# Patient Record
Sex: Male | Born: 1990 | Race: White | Hispanic: No | Marital: Single | Smoking: Never smoker
Health system: Southern US, Community
[De-identification: ages and names within clinical notes are randomized; demographics above are authoritative.]

## PROBLEM LIST (undated history)

## (undated) DIAGNOSIS — F411 Generalized anxiety disorder: Secondary | ICD-10-CM

## (undated) DIAGNOSIS — M79609 Pain in unspecified limb: Secondary | ICD-10-CM

## (undated) DIAGNOSIS — I10 Essential (primary) hypertension: Secondary | ICD-10-CM

## (undated) DIAGNOSIS — IMO0002 Reserved for concepts with insufficient information to code with codable children: Secondary | ICD-10-CM

## (undated) DIAGNOSIS — R000 Tachycardia, unspecified: Secondary | ICD-10-CM

## (undated) HISTORY — DX: Reserved for concepts with insufficient information to code with codable children: IMO0002

## (undated) HISTORY — DX: Tachycardia, unspecified: R00.0

## (undated) HISTORY — DX: Pain in unspecified limb: M79.609

## (undated) HISTORY — DX: Essential (primary) hypertension: I10

## (undated) HISTORY — DX: Generalized anxiety disorder: F41.1

---

## 2010-08-30 ENCOUNTER — Ambulatory Visit: Payer: Self-pay | Admitting: Family Medicine

## 2010-11-24 ENCOUNTER — Encounter: Payer: Self-pay | Admitting: Internal Medicine

## 2010-11-24 ENCOUNTER — Ambulatory Visit (INDEPENDENT_AMBULATORY_CARE_PROVIDER_SITE_OTHER): Payer: BC Managed Care – PPO | Admitting: Internal Medicine

## 2010-11-24 ENCOUNTER — Telehealth: Payer: Self-pay | Admitting: Internal Medicine

## 2010-11-24 VITALS — BP 149/102 | HR 127 | Temp 98.7°F | Resp 20 | Ht 66.0 in | Wt 264.0 lb

## 2010-11-24 DIAGNOSIS — R000 Tachycardia, unspecified: Secondary | ICD-10-CM

## 2010-11-24 DIAGNOSIS — I10 Essential (primary) hypertension: Secondary | ICD-10-CM

## 2010-11-24 DIAGNOSIS — E785 Hyperlipidemia, unspecified: Secondary | ICD-10-CM

## 2010-11-24 DIAGNOSIS — M722 Plantar fascial fibromatosis: Secondary | ICD-10-CM

## 2010-11-24 MED ORDER — NEBIVOLOL HCL 5 MG PO TABS: 5.0000 mg | ORAL_TABLET | Freq: Every day | ORAL | Status: DC

## 2010-11-24 NOTE — Telephone Encounter (Signed)
Message copied by Virgina Evener on Fri Nov 24, 2010  6:41 PM ------      Message from: Ronna Polio A      Created: Fri Nov 24, 2010  3:37 PM      Regarding: EKG       EKG was normal.

## 2010-11-24 NOTE — Patient Instructions (Signed)
Labs next week. Follow up 2 weeks.

## 2010-11-24 NOTE — Progress Notes (Signed)
Subjective:    Patient ID: Guy Pearson, male    DOB: 1991-02-11, 20 y.o.   MRN: [id number]  HPI  Guy Pearson is a 20 year old male who presents to establish care. He reports that it has been many years since he has seen a physician. He notes that recently and when he checks his blood pressure it has been elevated. He reports some blurred vision, neck pain, and headaches associated with his high blood pressure. He has never taken meds for high blood pressure. He has a strong family history of high blood pressure and heart disease. He reports a diet that is high in saturated fat. He does not exercise regularly.  Guy Pearson is also concerned about pain in his right foot. The pain is located in his medial right heel. It has been present for several months. He has tried applying ice to this area. He has also tried nonsteroidals. He is currently using his father's foot inserts help with pain. He reports minimal improvement with this orthotic.   Review of Systems  Constitutional: Negative for fever, chills, activity change, appetite change, fatigue and unexpected weight change.  HENT: Positive for neck pain.   Eyes: Positive for visual disturbance.  Respiratory: Negative for cough and shortness of breath.   Cardiovascular: Negative for chest pain, palpitations and leg swelling.  Gastrointestinal: Negative for abdominal pain and abdominal distention.  Genitourinary: Negative for dysuria, urgency and difficulty urinating.  Musculoskeletal: Negative for arthralgias and gait problem.  Skin: Negative for color change and rash.  Neurological: Positive for headaches.  Hematological: Negative for adenopathy.  Psychiatric/Behavioral: Negative for sleep disturbance and dysphoric mood. The patient is not nervous/anxious.    BP 149/102  Pulse 127  Temp(Src) 98.7 F (37.1 C) (Oral)  Resp 20  Ht 5\' 6"  (1.676 m)  Wt 264 lb (119.75 kg)  BMI 42.61 kg/m2  SpO2 95%     Objective:   Physical Exam    Constitutional: He is oriented to person, place, and time. He appears well-developed and well-nourished. No distress.  HENT:  Head: Normocephalic and atraumatic.  Right Ear: Tympanic membrane, external ear and ear canal normal.  Left Ear: Tympanic membrane, external ear and ear canal normal.  Nose: Nose normal.  Mouth/Throat: Oropharynx is clear and moist. No oropharyngeal exudate or posterior oropharyngeal erythema.  Eyes: Conjunctivae and EOM are normal. Pupils are equal, round, and reactive to light. Right eye exhibits no discharge. Left eye exhibits no discharge. No scleral icterus.  Neck: Normal range of motion. Neck supple. No tracheal deviation present. No thyromegaly present.  Cardiovascular: Normal rate, regular rhythm and normal heart sounds.  Exam reveals no gallop and no friction rub.   No murmur heard. Pulmonary/Chest: Effort normal and breath sounds normal. No respiratory distress. He has no wheezes. He has no rales. He exhibits no tenderness.  Abdominal: Soft. Bowel sounds are normal. He exhibits no distension and no mass. There is no tenderness. There is no rebound and no guarding.  Musculoskeletal: Normal range of motion. He exhibits no edema.       Feet:  Lymphadenopathy:    He has no cervical adenopathy.  Neurological: He is alert and oriented to person, place, and time. No cranial nerve deficit. Coordination normal.  Skin: Skin is warm and dry. No rash noted. He is not diaphoretic. No erythema. No pallor.  Psychiatric: His behavior is normal. Judgment and thought content normal. His mood appears anxious. Cognition and memory are normal.  Assessment & Plan:  1. Hypertension -patient with hypertension. Patient was given samples of bystolic to start today. He will start with 5 mg daily. He will increase to 10 mg daily if his blood pressure continues to be above 140/90. We will check renal function and urine microalbumin with lab work. He had an EKG performed  today which showed sinus tachycardia but was otherwise normal. He will followup in 2 weeks.  2. Plantar fasciitis - patient with right plantar fasciitis. He has tried conservative measures including icing and nonsteroidals. We will plan to refer to podiatry for evaluation for orthotics and possible steroid injection.  3. Hyperlipidemia - Will check lipids with labs next week.  4. Health Maintenance - will check fasting lipids with labs next week. The patient was offered flu vaccine and tetanus and pertussis vaccine today but refused.

## 2010-11-27 ENCOUNTER — Encounter: Payer: Self-pay | Admitting: *Deleted

## 2010-11-27 NOTE — Telephone Encounter (Signed)
Patient notified by letter

## 2010-12-01 ENCOUNTER — Other Ambulatory Visit: Payer: BC Managed Care – PPO

## 2010-12-08 ENCOUNTER — Ambulatory Visit: Payer: BC Managed Care – PPO | Admitting: Internal Medicine

## 2011-02-14 ENCOUNTER — Encounter: Payer: Self-pay | Admitting: Internal Medicine

## 2011-02-14 ENCOUNTER — Ambulatory Visit (INDEPENDENT_AMBULATORY_CARE_PROVIDER_SITE_OTHER): Payer: BC Managed Care – PPO | Admitting: Internal Medicine

## 2011-02-14 ENCOUNTER — Ambulatory Visit (INDEPENDENT_AMBULATORY_CARE_PROVIDER_SITE_OTHER)
Admission: RE | Admit: 2011-02-14 | Discharge: 2011-02-14 | Disposition: A | Discharge status: Home / Self Care | Payer: BC Managed Care – PPO | Source: Ambulatory Visit | Attending: Internal Medicine | Admitting: Internal Medicine

## 2011-02-14 VITALS — BP 146/82 | HR 102 | Temp 98.4°F | Wt 262.0 lb

## 2011-02-14 DIAGNOSIS — J40 Bronchitis, not specified as acute or chronic: Secondary | ICD-10-CM

## 2011-02-14 DIAGNOSIS — F411 Generalized anxiety disorder: Secondary | ICD-10-CM

## 2011-02-14 DIAGNOSIS — F419 Anxiety disorder, unspecified: Secondary | ICD-10-CM

## 2011-02-14 MED ORDER — BUPROPION HCL ER (XL) 150 MG PO TB24: 150.0000 mg | ORAL_TABLET | Freq: Every day | ORAL | Status: DC

## 2011-02-14 MED ORDER — GUAIFENESIN-CODEINE 100-10 MG/5ML PO SYRP: 5.0000 mL | ORAL_SOLUTION | Freq: Three times a day (TID) | ORAL | Status: AC | PRN

## 2011-02-14 MED ORDER — ALPRAZOLAM 0.25 MG PO TABS: 0.2500 mg | ORAL_TABLET | Freq: Every evening | ORAL | Status: AC | PRN

## 2011-02-14 MED ORDER — AZITHROMYCIN 250 MG PO TABS: ORAL_TABLET | ORAL | Status: AC

## 2011-02-14 NOTE — Progress Notes (Signed)
Subjective:    Patient ID: Guy Pearson, male    DOB: 07/19/1990, 20 y.o.   MRN: [id number]  HPI 20 year old male presents for an acute visit complaining of five-day history of nasal congestion, cough, pleuritic chest pain, fever, chills, myalgia. He first developed symptoms about 5 days ago with sore throat and dry cough. He reports that the cough worsened over the weekend and was so bad that he had some emesis after coughing spells. He then developed some right-sided pleuritic chest pain and upper abdominal pain associated with cough. The cough continues to be dry with very little mucus. He's also noted some nasal congestion and sore throat. He tried rest and using over-the-counter cough and cold medicines this weekend with minimal improvement. He continues to feel fatigued and have some chest tightness and shortness of breath.  He is also concerned about a several year history of progressive anxiety. He notes that he often has panic attacks when interacting with individuals. He describes these episodes as severe escalation of anxiety with chest tightness and sweating. This has affected his ability to function in his job. He is interested in trying medication to help with this.  Outpatient Encounter Prescriptions as of 02/14/2011  Medication Sig Dispense Refill  . nebivolol (BYSTOLIC) 5 MG tablet Take 1 tablet (5 mg total) by mouth daily.  30 tablet  11    Review of Systems  Constitutional: Positive for fever and chills. Negative for activity change and fatigue.  HENT: Positive for congestion and sinus pressure. Negative for hearing loss, ear pain, nosebleeds, sore throat, rhinorrhea, sneezing, trouble swallowing, neck pain, neck stiffness, voice change, postnasal drip, tinnitus and ear discharge.   Eyes: Negative for discharge, redness, itching and visual disturbance.  Respiratory: Positive for cough, chest tightness and shortness of breath. Negative for wheezing and stridor.   Cardiovascular:  Negative for chest pain and leg swelling.  Gastrointestinal: Positive for abdominal pain.  Musculoskeletal: Positive for myalgias. Negative for arthralgias.  Skin: Negative for color change and rash.  Neurological: Negative for dizziness, facial asymmetry and headaches.  Psychiatric/Behavioral: Negative for sleep disturbance and dysphoric mood. The patient is nervous/anxious.    BP 146/82  Pulse 102  Temp(Src) 98.4 F (36.9 C) (Oral)  Wt 262 lb (118.842 kg)  SpO2 97%     Objective:   Physical Exam  Constitutional: He is oriented to person, place, and time. He appears well-developed and well-nourished. No distress.  HENT:  Head: Normocephalic and atraumatic.  Right Ear: External ear normal.  Left Ear: External ear normal.  Nose: Nose normal.  Mouth/Throat: Oropharynx is clear and moist. No oropharyngeal exudate.  Eyes: Conjunctivae and EOM are normal. Pupils are equal, round, and reactive to light. Right eye exhibits no discharge. Left eye exhibits no discharge. No scleral icterus.  Neck: Normal range of motion. Neck supple. No tracheal deviation present. No thyromegaly present.  Cardiovascular: Normal rate, regular rhythm and normal heart sounds.  Exam reveals no gallop and no friction rub.   No murmur heard. Pulmonary/Chest: Effort normal. No respiratory distress. He has decreased breath sounds (prolonged expiration). He has no wheezes. He has no rales. He exhibits no tenderness.  Musculoskeletal: Normal range of motion. He exhibits no edema.  Lymphadenopathy:    He has no cervical adenopathy.  Neurological: He is alert and oriented to person, place, and time. No cranial nerve deficit. Coordination normal.  Skin: Skin is warm and dry. No rash noted. He is not diaphoretic. No erythema. No pallor.  Psychiatric:  His behavior is normal. Judgment and thought content normal. His mood appears anxious.          Assessment & Plan:  1. Bronchitis -symptoms are most consistent with  viral upper respiratory infection that has progressed to bronchitis. Will get chest x-ray given his persistent cough and chest pain. Will treat with a azithromycin and use codeine as needed for cough. He will followup in one month or earlier if symptoms are not improving.  2. Anxiety - patient with severe anxiety and panic attacks. Will start Wellbutrin and will use Xanax as needed for severe episodes. We discussed that Xanax can be sedating and that he must be cautious with its use. He will followup in one month.

## 2011-03-15 ENCOUNTER — Ambulatory Visit (INDEPENDENT_AMBULATORY_CARE_PROVIDER_SITE_OTHER): Payer: BC Managed Care – PPO | Admitting: Internal Medicine

## 2011-03-15 ENCOUNTER — Encounter: Payer: Self-pay | Admitting: Internal Medicine

## 2011-03-15 DIAGNOSIS — M542 Cervicalgia: Secondary | ICD-10-CM | POA: Insufficient documentation

## 2011-03-15 DIAGNOSIS — F411 Generalized anxiety disorder: Secondary | ICD-10-CM

## 2011-03-15 DIAGNOSIS — J029 Acute pharyngitis, unspecified: Secondary | ICD-10-CM

## 2011-03-15 DIAGNOSIS — J020 Streptococcal pharyngitis: Secondary | ICD-10-CM

## 2011-03-15 DIAGNOSIS — R1310 Dysphagia, unspecified: Secondary | ICD-10-CM

## 2011-03-15 DIAGNOSIS — I10 Essential (primary) hypertension: Secondary | ICD-10-CM

## 2011-03-15 DIAGNOSIS — F419 Anxiety disorder, unspecified: Secondary | ICD-10-CM | POA: Insufficient documentation

## 2011-03-15 MED ORDER — AMOXICILLIN-POT CLAVULANATE 875-125 MG PO TABS: 1.0000 | ORAL_TABLET | Freq: Two times a day (BID) | ORAL | Status: DC

## 2011-03-15 MED ORDER — NEBIVOLOL HCL 5 MG PO TABS: 5.0000 mg | ORAL_TABLET | Freq: Every day | ORAL | Status: DC

## 2011-03-15 MED ORDER — BUPROPION HCL ER (XL) 300 MG PO TB24: 300.0000 mg | ORAL_TABLET | Freq: Every day | ORAL | Status: DC

## 2011-03-15 MED ORDER — CYCLOBENZAPRINE HCL 5 MG PO TABS: 5.0000 mg | ORAL_TABLET | Freq: Three times a day (TID) | ORAL | Status: DC | PRN

## 2011-03-15 NOTE — Assessment & Plan Note (Signed)
Pt has not been taking Bystolic. BP elevated. Goal BP 120/80. Will resume Bystolic today.  Follow up 1 month.
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
## 2011-03-15 NOTE — Assessment & Plan Note (Signed)
Sore throat x 5 days. Exudate and erythema on exam.  No cough, congestion. Consistent with strep. Will treat with augmentin and use ibuprofen prn pain. Follow up if symptoms not improving over next 72hr.

## 2011-03-15 NOTE — Assessment & Plan Note (Signed)
Symptoms improved but not completely resolved with Wellbutrin 150mg  daily. Will increase to 300mg  daily. Will continue prn xanax for episodes of panic. Follow up in 1 month.

## 2011-03-21 ENCOUNTER — Encounter: Payer: Self-pay | Admitting: Internal Medicine

## 2011-03-21 ENCOUNTER — Ambulatory Visit (INDEPENDENT_AMBULATORY_CARE_PROVIDER_SITE_OTHER): Payer: BC Managed Care – PPO | Admitting: Internal Medicine

## 2011-03-21 VITALS — BP 166/92 | HR 107 | Temp 98.0°F | Ht 66.0 in | Wt 264.0 lb

## 2011-03-21 DIAGNOSIS — J40 Bronchitis, not specified as acute or chronic: Secondary | ICD-10-CM

## 2011-03-21 DIAGNOSIS — I10 Essential (primary) hypertension: Secondary | ICD-10-CM

## 2011-03-21 DIAGNOSIS — F411 Generalized anxiety disorder: Secondary | ICD-10-CM

## 2011-03-21 DIAGNOSIS — M542 Cervicalgia: Secondary | ICD-10-CM

## 2011-03-21 DIAGNOSIS — F419 Anxiety disorder, unspecified: Secondary | ICD-10-CM

## 2011-03-21 MED ORDER — LEVOFLOXACIN 750 MG PO TABS: 750.0000 mg | ORAL_TABLET | Freq: Every day | ORAL | Status: AC

## 2011-03-21 MED ORDER — CYCLOBENZAPRINE HCL 10 MG PO TABS: 10.0000 mg | ORAL_TABLET | Freq: Three times a day (TID) | ORAL | Status: AC | PRN

## 2011-03-21 MED ORDER — METOPROLOL SUCCINATE ER 25 MG PO TB24: 25.0000 mg | ORAL_TABLET | Freq: Every day | ORAL | Status: DC

## 2011-03-21 NOTE — Assessment & Plan Note (Signed)
Symptoms and exam are most consistent with bronchitis and perhaps early pneumonia in the right lower lobe. Will treat with Levaquin x7 days. He will call or e-mail with update later this week. He will call immediately if symptoms are worsening. Otherwise, he will followup in one month.

## 2011-03-21 NOTE — Assessment & Plan Note (Signed)
Patient reports significant improvement with use of Flexeril. We'll plan to continue. Patient will followup in one month.

## 2011-03-21 NOTE — Assessment & Plan Note (Signed)
Patient reports significant improvement on current dose of Wellbutrin. We'll plan to continue. He will followup in one month.

## 2011-03-21 NOTE — Progress Notes (Signed)
Subjective:    Patient ID: Guy Pearson, male    DOB: 1990/05/05, 21 y.o.   MRN: [id number]  HPI 21 year old male with a recent history of strep pharyngitis presents for followup complaining of several days of cough, shortness of breath, diffuse myalgias, and chills. He reports that his symptoms developed about 48 hours ago. They were at their worst yesterday. He took ibuprofen with improvement in his myalgia. He notes that yesterday he had a dry cough which led to some chest and abdominal pain. He denies any cough productive of sputum. He reports some shortness of breath yesterday which is now resolved. He has not had fever. He reports compliance with his Augmentin which she was taking for strep throat diagnosed last week. Aside from Augmentin and ibuprofen he has not been using any other cough or cold medications.  In regards to his anxiety, he reports that the increase in his dose of Wellbutrin significantly improved his symptoms.  In regards to his chronic upper back pain, he reports significant improvement in his symptoms with the use of Flexeril.  In regards to his hypertension, he notes that he has not yet filled his prescription for her Bystolic. He notes that this prescription is over $50. He is also not yet had his lab work performed. He has not been regularly checking his blood pressure.  Outpatient Encounter Prescriptions as of 03/21/2011  Medication Sig Dispense Refill  . buPROPion (WELLBUTRIN XL) 300 MG 24 hr tablet Take 1 tablet (300 mg total) by mouth daily.  30 tablet  6  . cyclobenzaprine (FLEXERIL) 10 MG tablet Take 1 tablet (10 mg total) by mouth 3 (three) times daily as needed for muscle spasms.  60 tablet  3  . DISCONTD: cyclobenzaprine (FLEXERIL) 5 MG tablet Take 1 tablet (5 mg total) by mouth 3 (three) times daily as needed for muscle spasms.  30 tablet  1  . DISCONTD: nebivolol (BYSTOLIC) 5 MG tablet Take 1 tablet (5 mg total) by mouth daily.  30 tablet  11  . levofloxacin  (LEVAQUIN) 750 MG tablet Take 1 tablet (750 mg total) by mouth daily.  7 tablet  0  . metoprolol succinate (TOPROL-XL) 25 MG 24 hr tablet Take 1 tablet (25 mg total) by mouth daily.  90 tablet  3    Review of Systems  Constitutional: Positive for chills. Negative for fever, activity change and fatigue.  HENT: Positive for congestion and sore throat. Negative for hearing loss, ear pain, nosebleeds, rhinorrhea, sneezing, trouble swallowing, neck pain, neck stiffness, voice change, postnasal drip, sinus pressure, tinnitus and ear discharge.   Eyes: Negative for discharge, redness, itching and visual disturbance.  Respiratory: Positive for cough and shortness of breath. Negative for chest tightness, wheezing and stridor.   Cardiovascular: Negative for chest pain and leg swelling.  Musculoskeletal: Positive for myalgias, back pain and arthralgias.  Skin: Negative for color change and rash.  Neurological: Negative for dizziness, facial asymmetry and headaches.  Psychiatric/Behavioral: Negative for sleep disturbance. The patient is nervous/anxious.    BP 166/92  Pulse 107  Temp(Src) 98 F (36.7 C) (Oral)  Ht 5\' 6"  (1.676 m)  Wt 264 lb (119.75 kg)  BMI 42.61 kg/m2  SpO2 97%     Objective:   Physical Exam  Constitutional: He is oriented to person, place, and time. He appears well-developed and well-nourished. No distress.  HENT:  Head: Normocephalic and atraumatic.  Right Ear: External ear normal.  Left Ear: External ear normal.  Nose: Nose normal.  Mouth/Throat: Oropharynx is clear and moist. No oropharyngeal exudate.  Eyes: Conjunctivae and EOM are normal. Pupils are equal, round, and reactive to light. Right eye exhibits no discharge. Left eye exhibits no discharge. No scleral icterus.  Neck: Normal range of motion. Neck supple. No tracheal deviation present. No thyromegaly present.  Cardiovascular: Normal rate, regular rhythm and normal heart sounds.  Exam reveals no gallop and no  friction rub.   No murmur heard. Pulmonary/Chest: Effort normal. No accessory muscle usage. Not tachypneic. No respiratory distress. He has decreased breath sounds in the right lower field. He has no wheezes. He has rhonchi in the right lower field. He has no rales. He exhibits no tenderness.  Musculoskeletal: Normal range of motion. He exhibits no edema.  Lymphadenopathy:    He has no cervical adenopathy.  Neurological: He is alert and oriented to person, place, and time. No cranial nerve deficit. Coordination normal.  Skin: Skin is warm and dry. No rash noted. He is not diaphoretic. No erythema. No pallor.  Psychiatric: He has a normal mood and affect. His behavior is normal. Judgment and thought content normal.          Assessment & Plan:

## 2011-03-21 NOTE — Assessment & Plan Note (Signed)
Blood pressure poorly controlled. Patient has not been compliant with medications because of cost. He has also not yet had lab work performed. Will change medication to metoprolol starting at 25 mg daily. He will call or e-mail with update as to blood pressures and if any side effects. He will followup in clinic in one month.

## 2011-03-21 NOTE — Patient Instructions (Signed)
Start Levaquin (antibiotic) to treat bronchitis.  Stop Augmentin.  Stop Bystolic.  Start Metoprolol for blood pressure control.  Email or call with update later this week.

## 2011-03-23 ENCOUNTER — Ambulatory Visit: Payer: BC Managed Care – PPO | Admitting: Internal Medicine

## 2011-04-23 ENCOUNTER — Ambulatory Visit (INDEPENDENT_AMBULATORY_CARE_PROVIDER_SITE_OTHER): Payer: BC Managed Care – PPO | Admitting: Internal Medicine

## 2011-04-23 ENCOUNTER — Encounter: Payer: Self-pay | Admitting: Internal Medicine

## 2011-04-23 VITALS — BP 128/78 | HR 95 | Temp 98.3°F | Ht 66.0 in | Wt 264.0 lb

## 2011-04-23 DIAGNOSIS — I10 Essential (primary) hypertension: Secondary | ICD-10-CM

## 2011-04-23 DIAGNOSIS — F419 Anxiety disorder, unspecified: Secondary | ICD-10-CM

## 2011-04-23 DIAGNOSIS — R252 Cramp and spasm: Secondary | ICD-10-CM

## 2011-04-23 DIAGNOSIS — M542 Cervicalgia: Secondary | ICD-10-CM

## 2011-04-23 DIAGNOSIS — F411 Generalized anxiety disorder: Secondary | ICD-10-CM

## 2011-04-23 MED ORDER — BUPROPION HCL ER (XL) 300 MG PO TB24: 300.0000 mg | ORAL_TABLET | Freq: Every day | ORAL | Status: DC

## 2011-04-23 MED ORDER — METOPROLOL SUCCINATE ER 25 MG PO TB24
25.0000 mg | ORAL_TABLET | Freq: Every day | ORAL | Status: DC
Start: 2011-04-23 — End: 2011-10-22

## 2011-04-23 MED ORDER — CYCLOBENZAPRINE HCL 10 MG PO TABS: 10.0000 mg | ORAL_TABLET | Freq: Three times a day (TID) | ORAL | Status: DC | PRN

## 2011-04-23 NOTE — Assessment & Plan Note (Signed)
Well-controlled with Flexeril. Will continue.

## 2011-04-23 NOTE — Progress Notes (Signed)
Subjective:    Patient ID: Guy Pearson, male    DOB: 1990/05/25, 21 y.o.   MRN: [id number]  HPI 21 year old male with history of anxiety, hypertension, and chronic upper back pain presents for followup. In regards to his anxiety, he notes significant improvement with the use of Wellbutrin. He denies any side effects from this medication. In regards to his hypertension, he reports full compliance with his metoprolol. He denies any side effects from this medicine. He denies any chest pain, headache, palpitations. In regards to his chronic upper back pain, he notes some improvement with the use of Flexeril. He typically takes 10 mg at bedtime when he is having upper back cramping. He denies any side effects from this medicine. He denies having any new concerns today.  Outpatient Encounter Prescriptions as of 04/23/2011  Medication Sig Dispense Refill  . buPROPion (WELLBUTRIN XL) 300 MG 24 hr tablet Take 1 tablet (300 mg total) by mouth daily.  90 tablet  3  . cyclobenzaprine (FLEXERIL) 10 MG tablet Take 1 tablet (10 mg total) by mouth 3 (three) times daily as needed.  90 tablet  3  . metoprolol succinate (TOPROL-XL) 25 MG 24 hr tablet Take 1 tablet (25 mg total) by mouth daily.  90 tablet  3    Review of Systems  Constitutional: Negative for fever, chills, activity change, appetite change, fatigue and unexpected weight change.  HENT: Positive for neck pain.   Eyes: Negative for visual disturbance.  Respiratory: Negative for cough and shortness of breath.   Cardiovascular: Negative for chest pain, palpitations and leg swelling.  Gastrointestinal: Negative for abdominal pain and abdominal distention.  Genitourinary: Negative for dysuria, urgency and difficulty urinating.  Musculoskeletal: Positive for myalgias and back pain. Negative for arthralgias and gait problem.  Skin: Negative for color change and rash.  Hematological: Negative for adenopathy.  Psychiatric/Behavioral: Negative for sleep  disturbance and dysphoric mood. The patient is nervous/anxious.    BP 128/78  Pulse 95  Temp(Src) 98.3 F (36.8 C) (Oral)  Ht 5\' 6"  (1.676 m)  Wt 264 lb (119.75 kg)  BMI 42.61 kg/m2  SpO2 98%     Objective:   Physical Exam  Constitutional: He is oriented to person, place, and time. He appears well-developed and well-nourished. No distress.  HENT:  Head: Normocephalic and atraumatic.  Right Ear: External ear normal.  Left Ear: External ear normal.  Nose: Nose normal.  Mouth/Throat: Oropharynx is clear and moist. No oropharyngeal exudate.  Eyes: Conjunctivae and EOM are normal. Pupils are equal, round, and reactive to light. Right eye exhibits no discharge. Left eye exhibits no discharge. No scleral icterus.  Neck: Normal range of motion. Neck supple. No tracheal deviation present. No thyromegaly present.  Cardiovascular: Normal rate, regular rhythm and normal heart sounds.  Exam reveals no gallop and no friction rub.   No murmur heard. Pulmonary/Chest: Effort normal and breath sounds normal. No respiratory distress. He has no wheezes. He has no rales. He exhibits no tenderness.  Musculoskeletal: Normal range of motion. He exhibits no edema.       Cervical back: He exhibits pain and spasm. He exhibits normal range of motion and no tenderness.  Lymphadenopathy:    He has no cervical adenopathy.  Neurological: He is alert and oriented to person, place, and time. No cranial nerve deficit. Coordination normal.  Skin: Skin is warm and dry. No rash noted. He is not diaphoretic. No erythema. No pallor.  Psychiatric: He has a normal mood and affect.  His behavior is normal. Judgment and thought content normal.          Assessment & Plan:

## 2011-04-23 NOTE — Assessment & Plan Note (Signed)
Blood pressure well-controlled on metoprolol 25 mg daily. We will plan to continue. Followup in 6 months.

## 2011-04-23 NOTE — Assessment & Plan Note (Signed)
Improved with use of Wellbutrin. We'll continue at current dose. Followup 6 months.

## 2011-10-22 ENCOUNTER — Encounter: Payer: Self-pay | Admitting: Internal Medicine

## 2011-10-22 ENCOUNTER — Ambulatory Visit (INDEPENDENT_AMBULATORY_CARE_PROVIDER_SITE_OTHER): Payer: BC Managed Care – PPO | Admitting: Internal Medicine

## 2011-10-22 VITALS — BP 110/72 | HR 84 | Temp 97.8°F | Ht 66.0 in | Wt 263.5 lb

## 2011-10-22 DIAGNOSIS — I10 Essential (primary) hypertension: Secondary | ICD-10-CM

## 2011-10-22 DIAGNOSIS — F419 Anxiety disorder, unspecified: Secondary | ICD-10-CM

## 2011-10-22 DIAGNOSIS — M542 Cervicalgia: Secondary | ICD-10-CM

## 2011-10-22 DIAGNOSIS — J0110 Acute frontal sinusitis, unspecified: Secondary | ICD-10-CM

## 2011-10-22 DIAGNOSIS — F411 Generalized anxiety disorder: Secondary | ICD-10-CM

## 2011-10-22 MED ORDER — LEVOFLOXACIN 750 MG PO TABS: 750.0000 mg | ORAL_TABLET | Freq: Every day | ORAL | Status: AC

## 2011-10-22 MED ORDER — METOPROLOL SUCCINATE ER 25 MG PO TB24: 25.0000 mg | ORAL_TABLET | Freq: Every day | ORAL | Status: DC

## 2011-10-22 MED ORDER — BUPROPION HCL ER (XL) 300 MG PO TB24: 300.0000 mg | ORAL_TABLET | Freq: Every day | ORAL | Status: DC

## 2011-10-22 NOTE — Progress Notes (Signed)
Subjective:    Patient ID: Guy Pearson, male    DOB: [birth year], 21 y.o.   MRN: [id number]  HPI 21 year old male with history of hypertension and anxiety presents for followup. In regards to hypertension, he reports blood pressure has been well controlled with metoprolol. He denies any side effects from the medication. He did not bring a record of blood pressures today. In regards to anxiety, he reports symptoms well controlled with Wellbutrin. He denies any side effects from this medication.  He is concerned today about ongoing neck pain. He feels that chronic neck pain has recently worsened in the setting of sinusitis. He notes that he has frontal sinus pressure and pain with nasal congestion. Because of this, he is often up at night and he feels that this leads to increased tension in his upper back and neck and increased neck pain. He has been taking Flexeril with minimal improvement in his symptoms. He denies any shortness of breath, cough, fever, chills.  Outpatient Encounter Prescriptions as of 10/22/2011  Medication Sig Dispense Refill  . buPROPion (WELLBUTRIN XL) 300 MG 24 hr tablet Take 1 tablet (300 mg total) by mouth daily.  90 tablet  3  . cyclobenzaprine (FLEXERIL) 10 MG tablet Take 10 mg by mouth at bedtime as needed.      . metoprolol succinate (TOPROL-XL) 25 MG 24 hr tablet Take 1 tablet (25 mg total) by mouth daily.  90 tablet  3  . levofloxacin (LEVAQUIN) 750 MG tablet Take 1 tablet (750 mg total) by mouth daily.  7 tablet  0   BP 110/72  Pulse 84  Temp 97.8 F (36.6 C) (Oral)  Ht 5\' 6"  (1.676 m)  Wt 263 lb 8 oz (119.523 kg)  BMI 42.53 kg/m2  SpO2 97%  Review of Systems  Constitutional: Negative for fever, chills, activity change, appetite change, fatigue and unexpected weight change.  HENT: Positive for congestion and sinus pressure. Negative for sore throat and trouble swallowing.   Eyes: Negative for visual disturbance.  Respiratory: Negative for cough and shortness  of breath.   Cardiovascular: Negative for chest pain, palpitations and leg swelling.  Gastrointestinal: Negative for abdominal pain and abdominal distention.  Genitourinary: Negative for dysuria, urgency and difficulty urinating.  Musculoskeletal: Positive for myalgias, back pain and arthralgias. Negative for gait problem.  Skin: Negative for color change and rash.  Neurological: Positive for headaches.  Hematological: Negative for adenopathy.  Psychiatric/Behavioral: Negative for disturbed wake/sleep cycle and dysphoric mood. The patient is not nervous/anxious.        Objective:   Physical Exam  Constitutional: He is oriented to person, place, and time. He appears well-developed and well-nourished. No distress.  HENT:  Head: Normocephalic and atraumatic.  Right Ear: External ear and ear canal normal. A middle ear effusion is present.  Left Ear: External ear and ear canal normal. A middle ear effusion is present.  Nose: Mucosal edema present. Right sinus exhibits frontal sinus tenderness. Left sinus exhibits frontal sinus tenderness.  Mouth/Throat: Oropharynx is clear and moist. No oropharyngeal exudate.  Eyes: Conjunctivae and EOM are normal. Pupils are equal, round, and reactive to light. Right eye exhibits no discharge. Left eye exhibits no discharge. No scleral icterus.  Neck: Normal range of motion. Neck supple. No tracheal deviation present. No thyromegaly present.  Cardiovascular: Normal rate, regular rhythm and normal heart sounds.  Exam reveals no gallop and no friction rub.   No murmur heard. Pulmonary/Chest: Effort normal and breath sounds normal. No respiratory distress.  He has no wheezes. He has no rales. He exhibits no tenderness.  Musculoskeletal: Normal range of motion. He exhibits no edema.  Lymphadenopathy:    He has no cervical adenopathy.  Neurological: He is alert and oriented to person, place, and time. No cranial nerve deficit. Coordination normal.  Skin: Skin is  warm and dry. No rash noted. He is not diaphoretic. No erythema. No pallor.  Psychiatric: He has a normal mood and affect. His behavior is normal. Judgment and thought content normal.          Assessment & Plan:

## 2011-10-22 NOTE — Assessment & Plan Note (Signed)
Blood pressure well-controlled on current medication. Will continue. Encouraged patient to followup and have screening labs performed.

## 2011-10-22 NOTE — Assessment & Plan Note (Signed)
Symptoms well controlled with Wellbutrin 300 mg daily. Will continue. Follow up 6 months.

## 2011-10-22 NOTE — Assessment & Plan Note (Signed)
Symptoms consistent with acute frontal sinusitis. Will treat with Levaquin. Patient will call if symptoms are not improving.

## 2011-10-22 NOTE — Assessment & Plan Note (Signed)
Patient with chronic back pain. Feels that symptoms are recently made worse with sinus infection. Will treat sinus infection and continue Flexeril as needed. Patient will call if symptoms not improving. If no improvement, would favor referral to orthopedics.

## 2012-04-21 ENCOUNTER — Ambulatory Visit: Payer: BC Managed Care – PPO | Admitting: Internal Medicine

## 2012-04-24 ENCOUNTER — Ambulatory Visit (INDEPENDENT_AMBULATORY_CARE_PROVIDER_SITE_OTHER): Payer: BC Managed Care – PPO | Admitting: Internal Medicine

## 2012-04-24 ENCOUNTER — Encounter: Payer: Self-pay | Admitting: Internal Medicine

## 2012-04-24 VITALS — BP 140/90 | HR 114 | Temp 98.4°F | Wt 270.0 lb

## 2012-04-24 DIAGNOSIS — I10 Essential (primary) hypertension: Secondary | ICD-10-CM

## 2012-04-24 DIAGNOSIS — F419 Anxiety disorder, unspecified: Secondary | ICD-10-CM

## 2012-04-24 DIAGNOSIS — E669 Obesity, unspecified: Secondary | ICD-10-CM

## 2012-04-24 DIAGNOSIS — F411 Generalized anxiety disorder: Secondary | ICD-10-CM

## 2012-04-24 NOTE — Assessment & Plan Note (Signed)
Pt stopped medication. Symptoms relatively well controlled. He will call if any changes/concerns.

## 2012-04-24 NOTE — Progress Notes (Signed)
  Subjective:    Patient ID: Guy Pearson, male    DOB: 1990/10/16, 22 y.o.   MRN: [id number]  HPI 22YO male with HTN, obesity, anxiety presents for follow up. Stopped wellbutrin and metoprolol 1 month ago. Does not wish to take any medications at this time. Acknowledges some anxiety, but feels he can deal with this. Refuses to have labs drawn to assess renal function, lipids. Would prefer to make efforts at weight loss with exercise prior to labs. Does not feel that diet is contributing to weight gain.  Outpatient Encounter Prescriptions as of 04/24/2012  Medication Sig Dispense Refill  . [DISCONTINUED] buPROPion (WELLBUTRIN XL) 300 MG 24 hr tablet Take 1 tablet (300 mg total) by mouth daily.  90 tablet  3  . [DISCONTINUED] cyclobenzaprine (FLEXERIL) 10 MG tablet Take 10 mg by mouth at bedtime as needed.      . [DISCONTINUED] metoprolol succinate (TOPROL-XL) 25 MG 24 hr tablet Take 1 tablet (25 mg total) by mouth daily.  90 tablet  3   No facility-administered encounter medications on file as of 04/24/2012.   BP 140/90  Pulse 114  Temp(Src) 98.4 F (36.9 C) (Oral)  Wt 270 lb (122.471 kg)  BMI 43.6 kg/m2  SpO2 98%  Review of Systems  Constitutional: Negative for fever, chills, activity change, appetite change, fatigue and unexpected weight change.  Eyes: Negative for visual disturbance.  Respiratory: Negative for cough and shortness of breath.   Cardiovascular: Negative for chest pain, palpitations and leg swelling.  Gastrointestinal: Negative for abdominal pain and abdominal distention.  Genitourinary: Negative for dysuria, urgency and difficulty urinating.  Musculoskeletal: Negative for arthralgias and gait problem.  Skin: Negative for color change and rash.  Hematological: Negative for adenopathy.  Psychiatric/Behavioral: Negative for sleep disturbance and dysphoric mood. The patient is not nervous/anxious.        Objective:   Physical Exam  Constitutional: He is oriented to  person, place, and time. He appears well-developed and well-nourished. No distress.  HENT:  Head: Normocephalic and atraumatic.  Right Ear: External ear normal.  Left Ear: External ear normal.  Nose: Nose normal.  Mouth/Throat: Oropharynx is clear and moist. No oropharyngeal exudate.  Eyes: Conjunctivae and EOM are normal. Pupils are equal, round, and reactive to light. Right eye exhibits no discharge. Left eye exhibits no discharge. No scleral icterus.  Neck: Normal range of motion. Neck supple. No tracheal deviation present. No thyromegaly present.  Cardiovascular: Regular rhythm and normal heart sounds.  Tachycardia present.  Exam reveals no gallop and no friction rub.   No murmur heard. Pulmonary/Chest: Effort normal and breath sounds normal. No respiratory distress. He has no wheezes. He has no rales. He exhibits no tenderness.  Musculoskeletal: Normal range of motion. He exhibits no edema.  Lymphadenopathy:    He has no cervical adenopathy.  Neurological: He is alert and oriented to person, place, and time. No cranial nerve deficit. Coordination normal.  Skin: Skin is warm and dry. No rash noted. He is not diaphoretic. No erythema. No pallor.  Psychiatric: He has a normal mood and affect. His behavior is normal. Judgment and thought content normal.          Assessment & Plan:

## 2012-04-24 NOTE — Assessment & Plan Note (Signed)
BP Readings from Last 3 Encounters:  04/24/12 140/90  10/22/11 110/72  04/23/11 128/78   BP elevated today. Pt has not taken Metoprolol in over 1 month. Declines use of any other medications at this time. Declines check of renal function.

## 2012-04-24 NOTE — Assessment & Plan Note (Signed)
  Body mass index is 43.6 kg/(m^2).  Encouraged effort at weight loss including keeping a food diary and setting goal of exercise 3 times per week.

## 2013-03-20 ENCOUNTER — Ambulatory Visit: Payer: Self-pay | Admitting: Physician Assistant

## 2013-09-07 ENCOUNTER — Ambulatory Visit: Payer: Self-pay

## 2013-09-07 LAB — CBC WITH DIFFERENTIAL/PLATELET
Basophil #: 0 10*3/uL (ref 0.0–0.1)
Basophil %: 0.3 %
EOS ABS: 0.1 10*3/uL (ref 0.0–0.7)
Eosinophil %: 1.1 %
HCT: 46.5 % (ref 40.0–52.0)
HGB: 15.9 g/dL (ref 13.0–18.0)
Lymphocyte #: 2.6 10*3/uL (ref 1.0–3.6)
Lymphocyte %: 24.2 %
MCH: 29.8 pg (ref 26.0–34.0)
MCHC: 34.2 g/dL (ref 32.0–36.0)
MCV: 87 fL (ref 80–100)
MONOS PCT: 7.2 %
Monocyte #: 0.8 x10 3/mm (ref 0.2–1.0)
NEUTROS ABS: 7.3 10*3/uL — AB (ref 1.4–6.5)
Neutrophil %: 67.2 %
Platelet: 295 10*3/uL (ref 150–440)
RBC: 5.34 10*6/uL (ref 4.40–5.90)
RDW: 13 % (ref 11.5–14.5)
WBC: 10.9 10*3/uL — AB (ref 3.8–10.6)

## 2013-09-07 LAB — URINALYSIS, COMPLETE
Blood: NEGATIVE
Glucose,UR: NEGATIVE mg/dL (ref 0–75)
Ketone: NEGATIVE
Leukocyte Esterase: NEGATIVE
Nitrite: NEGATIVE
PH: 6 (ref 4.5–8.0)
Protein: 30
Specific Gravity: 1.025 (ref 1.003–1.030)

## 2013-09-07 LAB — COMPREHENSIVE METABOLIC PANEL
ALBUMIN: 4.2 g/dL (ref 3.4–5.0)
ALK PHOS: 72 U/L
ANION GAP: 10 (ref 7–16)
BUN: 18 mg/dL (ref 7–18)
Bilirubin,Total: 0.3 mg/dL (ref 0.2–1.0)
CHLORIDE: 101 mmol/L (ref 98–107)
CREATININE: 1 mg/dL (ref 0.60–1.30)
Calcium, Total: 9.4 mg/dL (ref 8.5–10.1)
Co2: 29 mmol/L (ref 21–32)
EGFR (African American): >60
EGFR (Non-African Amer.): >60
GLUCOSE: 100 mg/dL — AB (ref 65–99)
OSMOLALITY: 281 (ref 275–301)
POTASSIUM: 3.8 mmol/L (ref 3.5–5.1)
SGOT(AST): 17 U/L (ref 15–37)
SGPT (ALT): 38 U/L (ref 12–78)
Sodium: 140 mmol/L (ref 136–145)
TOTAL PROTEIN: 8.3 g/dL — AB (ref 6.4–8.2)

## 2013-09-07 LAB — AMYLASE: Amylase: 45 U/L (ref 25–115)

## 2013-09-07 LAB — MAGNESIUM: Magnesium: 2.2 mg/dL

## 2013-09-07 LAB — LIPASE, BLOOD: Lipase: 95 U/L (ref 73–393)

## 2013-09-07 LAB — RAPID STREP-A WITH REFLX: Micro Text Report: NEGATIVE

## 2013-09-09 LAB — URINE CULTURE

## 2013-09-10 LAB — BETA STREP CULTURE(ARMC)

## 2013-09-18 ENCOUNTER — Encounter: Payer: Self-pay | Admitting: *Deleted

## 2013-09-21 ENCOUNTER — Encounter: Payer: Self-pay | Admitting: Cardiovascular Disease

## 2013-09-21 ENCOUNTER — Ambulatory Visit (INDEPENDENT_AMBULATORY_CARE_PROVIDER_SITE_OTHER): Payer: BC Managed Care – PPO | Admitting: Cardiovascular Disease

## 2013-09-21 ENCOUNTER — Encounter (INDEPENDENT_AMBULATORY_CARE_PROVIDER_SITE_OTHER): Payer: Self-pay

## 2013-09-21 VITALS — BP 110/80 | HR 63 | Ht 66.0 in | Wt 240.5 lb

## 2013-09-21 DIAGNOSIS — E669 Obesity, unspecified: Secondary | ICD-10-CM

## 2013-09-21 DIAGNOSIS — F419 Anxiety disorder, unspecified: Secondary | ICD-10-CM

## 2013-09-21 DIAGNOSIS — F411 Generalized anxiety disorder: Secondary | ICD-10-CM

## 2013-09-21 DIAGNOSIS — I10 Essential (primary) hypertension: Secondary | ICD-10-CM

## 2013-09-21 DIAGNOSIS — R0602 Shortness of breath: Secondary | ICD-10-CM

## 2013-09-21 DIAGNOSIS — R0789 Other chest pain: Secondary | ICD-10-CM

## 2013-09-21 NOTE — Assessment & Plan Note (Signed)
Blood pressure is well controlled on today's visit. No changes made to the medications. 

## 2013-09-21 NOTE — Assessment & Plan Note (Signed)
Symptoms of shortness of breath likely from underlying anxiety. No prior issues before the recent medical homes with his father. Prior to this he was working out on a regular basis. No clinical signs of any cardiac pathology. We did mention if symptoms persist, regular treadmill stress test could be ordered

## 2013-09-21 NOTE — Patient Instructions (Signed)
You are doing well. No medication changes were made.  Ask Dr. Juanetta GoslingHawkins about an alternate medication: Trazodone for sleep Paxil for stress/anxiety instead of cymbalta  Please call if symptoms of shortness of breath do not improve  Please call us if you have new issues that need to be addressed before your next appt.

## 2013-09-21 NOTE — Assessment & Plan Note (Signed)
I suspect most of his symptoms are coming from anxiety. 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 Shortness of breath, chest tightness, insomnia, weight loss, poor appetite, not drinking. Significant fatigue.  he feels that he is having side effects from Cymbalta. We have suggested he talk with Dr. Juanetta GoslingHawkins. He did not take medication today . Uncertain if he might do better on a Holter medication such as Paxil, or other SSRI

## 2013-09-21 NOTE — Assessment & Plan Note (Signed)
Given his recent stressors adjustment disorder, recommended he start a regular walking program

## 2013-09-21 NOTE — Progress Notes (Signed)
   Patient ID: Guy Pearson, male    DOB: 07/14/1990, 23 y.o.   MRN: [id number]  HPI Comments: Guy Pearson is a 23 year old gentleman with history of anxiety, presenting with recent symptoms of shortness of breath, chest tightness, anorexia, insomnia. Referred by Dr. Juanetta GoslingHawkins for further evaluation.  His mother presents with him today and reports that she feels the patient has significant anxiety. There are significant home stressors. Patient's dad has colon cancer, recent surgical resection of his colon, now undergoing chemotherapy. Son has had to take all the responsibilities of their business. This has been relatively stressful.  He is up in the well, down 15 pounds per the patient, not drinking much. Reports that his stomach is upset, episodes of shortness of breath and chest tightness with exertion. Previously he was able to workout without any problems.   Recently was taking meloxicam for his back, this caused significant leg cramping  He recently started Cymbalta for anxiety. This has caused daytime fatigue that has been severe, nighttime insomnia EKG shows normal sinus rhythm with rate 63 beats a minute, no significant ST or T wave changes  Outpatient Encounter Prescriptions as of 09/21/2013  Medication Sig  . acetaminophen (TYLENOL) 500 MG tablet Take 500-1,000 mg by mouth 3 (three) times daily as needed.  Marland Kitchen. amLODipine (NORVASC) 5 MG tablet Take 5 mg by mouth daily.  . DULoxetine (CYMBALTA) 20 MG capsule Take 20 mg by mouth daily.  . metoprolol tartrate (LOPRESSOR) 25 MG tablet Take 25 mg by mouth 2 (two) times daily.    Review of Systems  Constitutional: Positive for fatigue.  HENT: Negative.   Eyes: Negative.   Respiratory: Positive for chest tightness and shortness of breath.   Cardiovascular: Negative.   Gastrointestinal: Negative.   Endocrine: Negative.   Musculoskeletal: Negative.   Skin: Negative.   Allergic/Immunologic: Negative.   Neurological: Negative.   Hematological:  Negative.   Psychiatric/Behavioral: Positive for sleep disturbance.  All other systems reviewed and are negative.   BP 110/80  Pulse 63  Ht 5\' 6"  (1.676 m)  Wt 240 lb 8 oz (109.09 kg)  BMI 38.84 kg/m2  Physical Exam  Nursing note and vitals reviewed. Constitutional: He is oriented to person, place, and time. He appears well-developed and well-nourished.  HENT:  Head: Normocephalic.  Nose: Nose normal.  Mouth/Throat: Oropharynx is clear and moist.  Eyes: Conjunctivae are normal. Pupils are equal, round, and reactive to light.  Neck: Normal range of motion. Neck supple. No JVD present.  Cardiovascular: Normal rate, regular rhythm, S1 normal, S2 normal, normal heart sounds and intact distal pulses.  Exam reveals no gallop and no friction rub.   No murmur heard. Pulmonary/Chest: Effort normal and breath sounds normal. No respiratory distress. He has no wheezes. He has no rales. He exhibits no tenderness.  Abdominal: Soft. Bowel sounds are normal. He exhibits no distension. There is no tenderness.  Musculoskeletal: Normal range of motion. He exhibits no edema and no tenderness.  Lymphadenopathy:    He has no cervical adenopathy.  Neurological: He is alert and oriented to person, place, and time. Coordination normal.  Skin: Skin is warm and dry. No rash noted. No erythema.  Psychiatric: He has a normal mood and affect. His behavior is normal. Judgment and thought content normal.      Assessment and Plan

## 2013-09-21 NOTE — Assessment & Plan Note (Signed)
Atypical chest discomfort. Coming on at rest, even in the exam room. Likely from anxiety. Recommended walking on a regular basis, stress reduction techniques. He likes playing video games.

## 2013-12-24 ENCOUNTER — Ambulatory Visit: Payer: Self-pay | Admitting: Physician Assistant

## 2015-03-16 ENCOUNTER — Other Ambulatory Visit: Payer: Self-pay | Admitting: Family Medicine

## 2015-04-19 ENCOUNTER — Other Ambulatory Visit: Payer: Self-pay | Admitting: Family Medicine

## 2015-12-13 IMAGING — CR DG CHEST 2V
1 series · 3 of 3 positions shown · non-contrast
Comparison: Chest x-ray of 09/07/2013

CLINICAL DATA: Reason bronchitis, now with chest tightness

EXAM:
CHEST  2 VIEW

[Series 1: pa · 0.17mm/px · 3 of 3 slices shown]
[im 1/3]
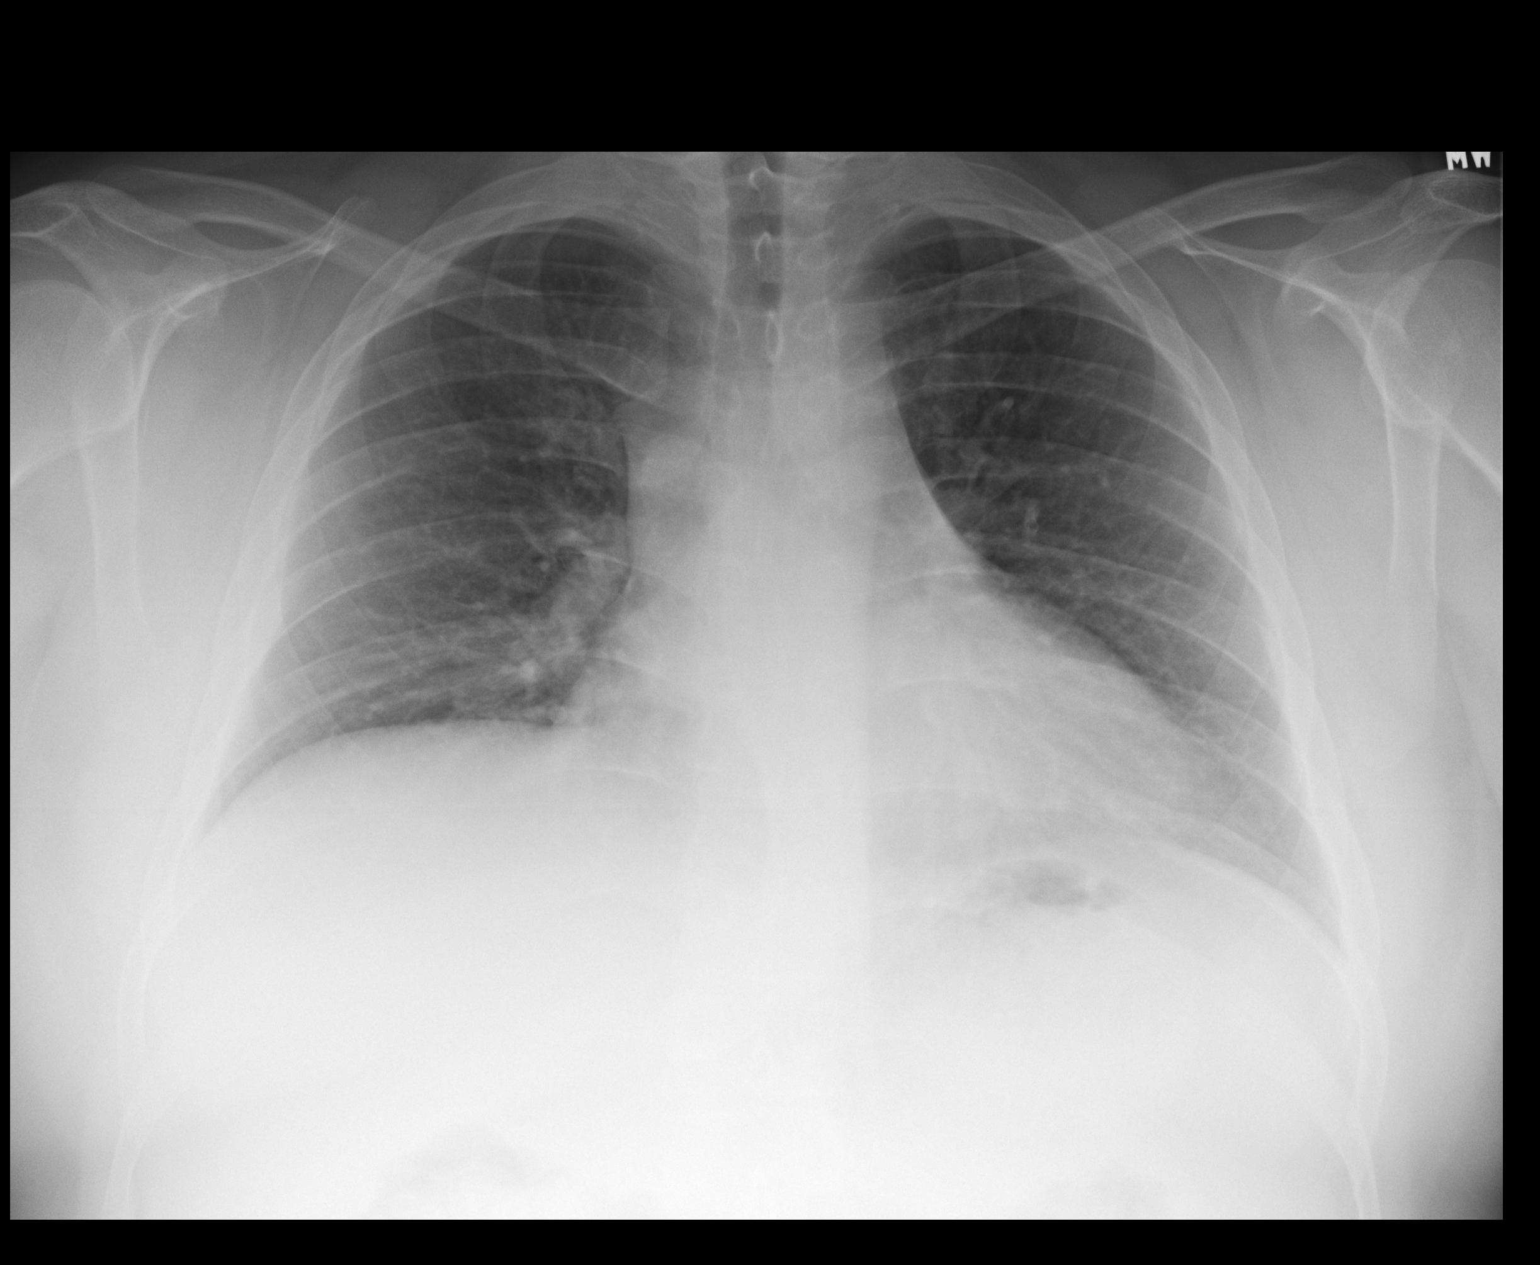
[im 2/3]
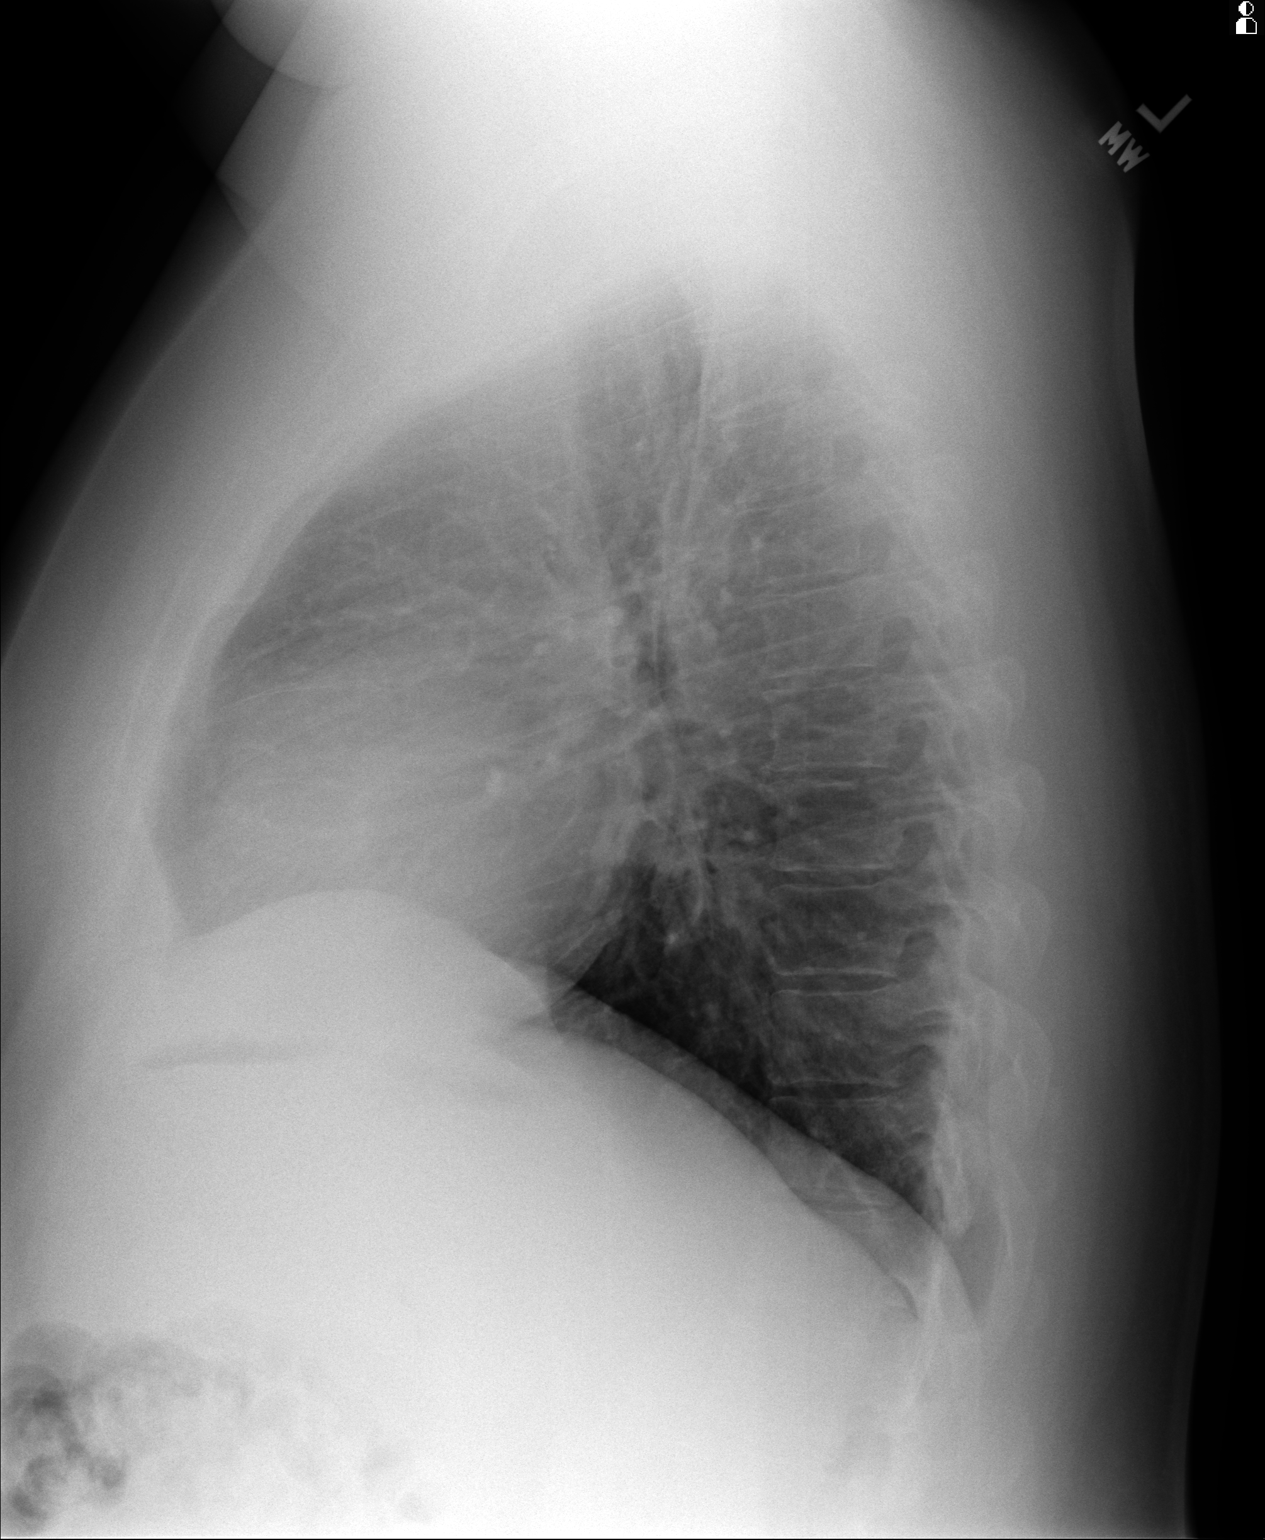
[im 3/3]
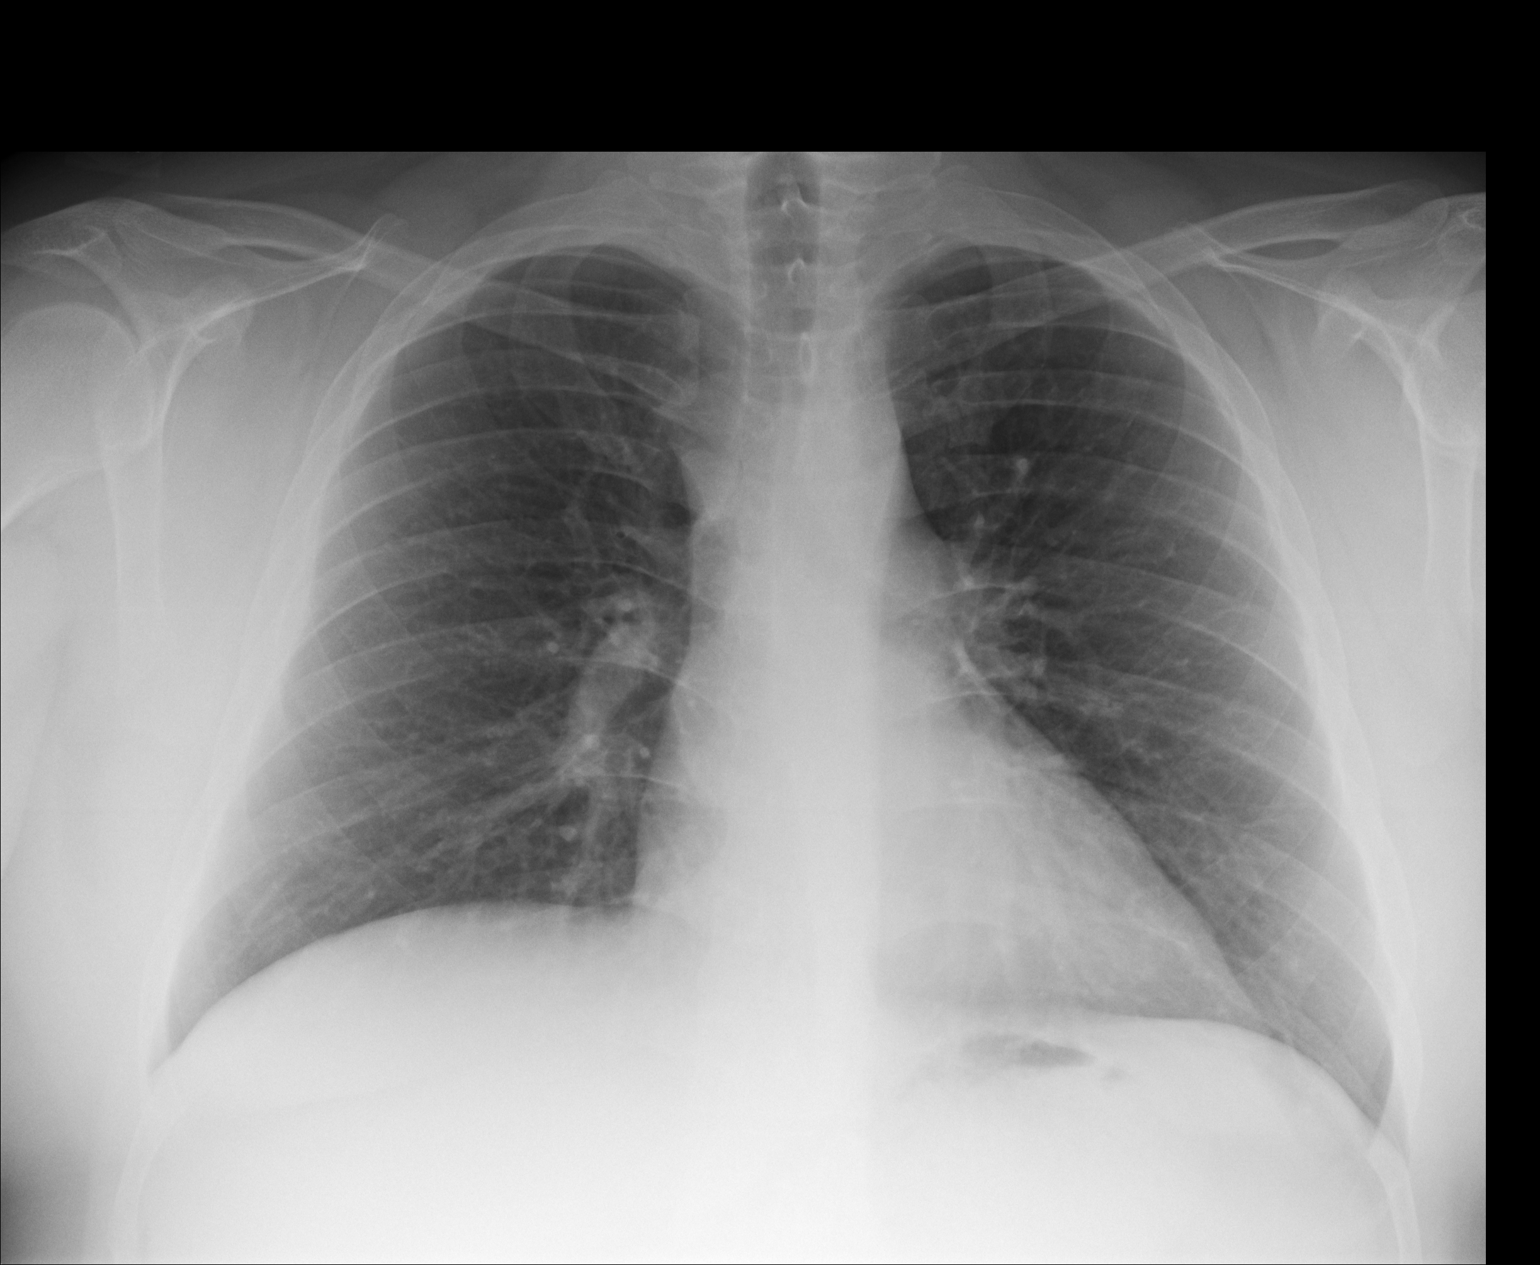

[3 of 3 positions shown; findings below may reference images not displayed]

FINDINGS: No active infiltrate or effusion is seen. Mediastinal and hilar
contours are unremarkable. The heart is within normal limits in
size. No bony abnormality is seen.
IMPRESSION: No active cardiopulmonary disease.

## 2019-10-16 ENCOUNTER — Other Ambulatory Visit: Payer: Self-pay

## 2019-10-16 ENCOUNTER — Ambulatory Visit
Admission: RE | Admit: 2019-10-16 | Discharge: 2019-10-16 | Disposition: A | Discharge status: Home / Self Care | Payer: BC Managed Care – PPO | Source: Ambulatory Visit | Attending: Family Medicine | Admitting: Family Medicine

## 2019-10-16 VITALS — BP 119/82 | HR 73 | Temp 98.9°F | Resp 16 | Ht 65.0 in | Wt 260.0 lb

## 2019-10-16 DIAGNOSIS — J029 Acute pharyngitis, unspecified: Secondary | ICD-10-CM | POA: Diagnosis not present

## 2019-10-16 LAB — GROUP A STREP BY PCR: Group A Strep by PCR: NOT DETECTED

## 2019-10-16 MED ORDER — AZITHROMYCIN 250 MG PO TABS: ORAL_TABLET | ORAL | 0 refills | Status: DC

## 2019-10-16 NOTE — Discharge Instructions (Signed)
Will call with results (need result before sending in treatment).  Take care  Dr. Adriana Simas

## 2019-10-16 NOTE — ED Triage Notes (Signed)
Patient c/o sore throat for the past 2 days.  Patient denies fevers.  

## 2019-10-16 NOTE — ED Provider Notes (Signed)
[facility]    CSN: [id number] Arrival date & time: 10/16/19  1055  History   Chief Complaint Chief Complaint  Patient presents with  . Appointment  . Sore Throat   HPI  29 year old male presents with sore throat.  2-day history of sore throat.  No fever.  No sick contacts.  Pain 3/10 in severity. Patient reports that he either has strep throat or tonsillitis.  No other associated symptoms.  With history  Past Medical History:  Diagnosis Date  . Anxiety state, unspecified   . Hypertension   . Other injury of other sites of trunk   . Pain in limb   . Tachycardia, unspecified    Patient Active Problem List   Diagnosis Date Noted  . Chest tightness 09/21/2013  . Shortness of breath 09/21/2013  . Obesity 04/24/2012  . Anxiety 03/15/2011  . Cervicalgia 03/15/2011  . Hypertension 11/24/2010  . Hyperlipidemia 11/24/2010   Home Medications    Prior to Admission medications   Medication Sig Start Date End Date Taking? Authorizing Provider  amLODipine (NORVASC) 5 MG tablet Take 5 mg by mouth daily.   Yes [provider]  DULoxetine (CYMBALTA) 20 MG capsule Take 20 mg by mouth daily.   Yes [provider]  metoprolol tartrate (LOPRESSOR) 25 MG tablet Take 25 mg by mouth 2 (two) times daily.   Yes [provider]  acetaminophen (TYLENOL) 500 MG tablet Take 500-1,000 mg by mouth 3 (three) times daily as needed.    [provider]  azithromycin (ZITHROMAX) 250 MG tablet 2 tablets on day 1, then 1 tablet daily on days 2-5. 10/16/19   Tommie Sams, DO    Family History Family History  Problem Relation Age of Onset  . Hypertension Mother   . Hypertension Father   . Arrhythmia Father        A-Fib  . Hyperlipidemia Paternal Grandfather   . Hypertension Paternal Grandfather   . Stroke Paternal Grandfather     Social History Social History   Tobacco Use  . Smoking status: Never Smoker  . Smokeless tobacco: Never Used  Vaping  Use  . Vaping Use: Never used  Substance Use Topics  . Alcohol use: No  . Drug use: No     Allergies   Menthol, Duloxetine, and Venlafaxine   Review of Systems Review of Systems  Constitutional: Negative for fever.  HENT: Positive for sore throat.    Physical Exam Triage Vital Signs ED Triage Vitals  Enc Vitals Group     BP 10/16/19 1109 119/82     Pulse Rate 10/16/19 1109 73     Resp 10/16/19 1109 16     Temp 10/16/19 1109 98.9 F (37.2 C)     Temp Source 10/16/19 1109 Oral     SpO2 10/16/19 1109 98 %     Weight 10/16/19 1106 260 lb (117.9 kg)     Height 10/16/19 1106 5\' 5"  (1.651 m)     Head Circumference --      Peak Flow --      Pain Score 10/16/19 1106 3     Pain Loc --      Pain Edu? --      Excl. in GC? --    Updated Vital Signs BP 119/82 (BP Location: Right Arm)   Pulse 73   Temp 98.9 F (37.2 C) (Oral)   Resp 16   Ht 5\' 5"  (1.651 m)   Wt 117.9 kg  SpO2 98%   BMI 43.27 kg/m   Visual Acuity Right Eye Distance:   Left Eye Distance:   Bilateral Distance:    Right Eye Near:   Left Eye Near:    Bilateral Near:     Physical Exam Vitals and nursing note reviewed.  Constitutional:      General: He is not in acute distress.    Appearance: Normal appearance. He is obese. He is not ill-appearing.  HENT:     Head: Normocephalic and atraumatic.     Mouth/Throat:     Pharynx: Oropharyngeal exudate and posterior oropharyngeal erythema present.  Eyes:     General:        Right eye: No discharge.        Left eye: No discharge.     Conjunctiva/sclera: Conjunctivae normal.  Cardiovascular:     Rate and Rhythm: Normal rate and regular rhythm.     Heart sounds: No murmur heard.   Pulmonary:     Effort: Pulmonary effort is normal.     Breath sounds: Normal breath sounds. No wheezing, rhonchi or rales.  Neurological:     Mental Status: He is alert.  Psychiatric:        Mood and Affect: Mood normal.        Behavior: Behavior normal.    UC  Treatments / Results  Labs (all labs ordered are listed, but only abnormal results are displayed) Labs Reviewed  GROUP A STREP BY PCR    EKG   Radiology No results found.  Procedures Procedures (including critical care time)  Medications Ordered in UC Medications - No data to display  Initial Impression / Assessment and Plan / UC Course  I have reviewed the triage vital signs and the nursing notes.  Pertinent labs & imaging results that were available during my care of the patient were reviewed by me and considered in my medical decision making (see chart for details).    29 year old male presents with pharyngitis.  Strep negative.  Atypical pathogens/other nongroup A strep versus viral.  Placing on azithromycin.  Final Clinical Impressions(s) / UC Diagnoses   Final diagnoses:  Acute pharyngitis, unspecified etiology     Discharge Instructions     Will call with results (need result before sending in treatment).  Take care  Dr. Adriana Simas    ED Prescriptions    Medication Sig Dispense Auth. Provider   azithromycin (ZITHROMAX) 250 MG tablet 2 tablets on day 1, then 1 tablet daily on days 2-5. 6 tablet Tommie Sams, DO     PDMP not reviewed this encounter.   Tommie Sams, [place] 10/16/19 1730

## 2020-06-21 ENCOUNTER — Ambulatory Visit (INDEPENDENT_AMBULATORY_CARE_PROVIDER_SITE_OTHER): Payer: BC Managed Care – PPO | Admitting: Urology

## 2020-06-21 ENCOUNTER — Other Ambulatory Visit: Payer: Self-pay

## 2020-06-21 ENCOUNTER — Encounter: Payer: Self-pay | Admitting: Urology

## 2020-06-21 VITALS — BP 118/74 | HR 80 | Ht 65.0 in | Wt 271.0 lb

## 2020-06-21 DIAGNOSIS — R7989 Other specified abnormal findings of blood chemistry: Secondary | ICD-10-CM | POA: Diagnosis not present

## 2020-06-21 DIAGNOSIS — N529 Male erectile dysfunction, unspecified: Secondary | ICD-10-CM

## 2020-06-21 MED ORDER — TADALAFIL 5 MG PO TABS: 5.0000 mg | ORAL_TABLET | ORAL | 6 refills | Status: DC

## 2020-06-21 MED ORDER — CLOMIPHENE CITRATE 50 MG PO TABS: 25.0000 mg | ORAL_TABLET | Freq: Every day | ORAL | 6 refills | Status: DC

## 2020-06-21 NOTE — Patient Instructions (Signed)
Erectile Dysfunction Erectile dysfunction (ED) is the inability to get or keep an erection in order to have sexual intercourse. ED is considered a symptom of an underlying disorder and not considered a disease. Erectile dysfunction may include:  Inability to get an erection.  Lack of enough hardness of the erection to allow penetration.  Loss of the erection before sex is finished. What are the causes? This condition may be caused by:  Certain medicines, such as: ? Pain relievers. ? Antihistamines. ? Antidepressants. ? Blood pressure medicines. ? Water pills (diuretics). ? Ulcer medicines. ? Muscle relaxants. ? Drugs.  Excessive drinking.  Psychological causes, such as: ? Anxiety. ? Depression. ? Sadness. ? Exhaustion. ? Performance fear. ? Stress.  Physical causes, such as: ? Artery problems. This may include diabetes, smoking, liver disease, or atherosclerosis. ? High blood pressure. ? Hormonal problems, such as low testosterone. ? Obesity. ? Nerve problems. This may include back or pelvic injuries, diabetes mellitus, multiple sclerosis, or Parkinson's disease. What are the signs or symptoms? Symptoms of this condition include:  Inability to get an erection.  Lack of enough hardness of the erection to allow penetration.  Loss of the erection before sex is finished.  Normal erections at some times, but with frequent unsatisfactory episodes.  Low sexual satisfaction in either partner due to erection problems.  A curved penis occurring with erection. The curve may cause pain or the penis may be too curved to allow for intercourse.  Never having nighttime erections. How is this diagnosed? This condition is often diagnosed by:  Performing a physical exam to find other diseases or specific problems with the penis.  Asking you detailed questions about the problem.  Performing blood tests to check for diabetes mellitus or to measure hormone levels.  Performing  other tests to check for underlying health conditions.  Performing an ultrasound exam to check for scarring.  Performing a test to check blood flow to the penis.  Doing a sleep study at home to measure nighttime erections. How is this treated? This condition may be treated by:  Medicine taken by mouth to help you achieve an erection (oral medicine).  Hormone replacement therapy to replace low testosterone levels.  Medicine that is injected into the penis. Your health care provider may instruct you how to give yourself these injections at home.  Vacuum pump. This is a pump with a ring on it. The pump and ring are placed on the penis and used to create pressure that helps the penis become erect.  Penile implant surgery. In this procedure, you may receive: ? An inflatable implant. This consists of cylinders, a pump, and a reservoir. The cylinders can be inflated with a fluid that helps to create an erection, and they can be deflated after intercourse. ? A semi-rigid implant. This consists of two silicone rubber rods. The rods provide some rigidity. They are also flexible, so the penis can both curve downward in its normal position and become straight for sexual intercourse.  Blood vessel surgery, to improve blood flow to the penis. During this procedure, a blood vessel from a different part of the body is placed into the penis to allow blood to flow around (bypass) damaged or blocked blood vessels.  Lifestyle changes, such as exercising more, losing weight, and quitting smoking. Follow these instructions at home: Medicines  Take over-the-counter and prescription medicines only as told by your health care provider. Do not increase the dosage without first discussing it with your health care   provider.  If you are using self-injections, perform injections as directed by your health care provider. Make sure to avoid any veins that are on the surface of the penis. After giving an injection,  apply pressure to the injection site for 5 minutes.   General instructions  Exercise regularly, as directed by your health care provider. Work with your health care provider to lose weight, if needed.  Do not use any products that contain nicotine or tobacco, such as cigarettes and e-cigarettes. If you need help quitting, ask your health care provider.  Before using a vacuum pump, read the instructions that come with the pump and discuss any questions with your health care provider.  Keep all follow-up visits as told by your health care provider. This is important. Contact a health care provider if:  You feel nauseous.  You vomit. Get help right away if:  You are taking oral or injectable medicines and you have an erection that lasts longer than 4 hours. If your health care provider is unavailable, go to the nearest emergency room for evaluation. An erection that lasts much longer than 4 hours can result in permanent damage to your penis.  You have severe pain in your groin or abdomen.  You develop redness or severe swelling of your penis.  You have redness spreading up into your groin or lower abdomen.  You are unable to urinate.  You experience chest pain or a rapid heart beat (palpitations) after taking oral medicines. Summary  Erectile dysfunction (ED) is the inability to get or keep an erection during sexual intercourse. This problem can usually be treated successfully.  This condition is diagnosed based on a physical exam, your symptoms, and tests to determine the cause. Treatment varies depending on the cause and may include medicines, hormone therapy, surgery, or a vacuum pump.  You may need follow-up visits to make sure that you are using your medicines or devices correctly.  Get help right away if you are taking or injecting medicines and you have an erection that lasts longer than 4 hours. This information is not intended to replace advice given to you by your health  care provider. Make sure you discuss any questions you have with your health care provider. Document Revised: 10/30/2019 Document Reviewed: 10/30/2019 Elsevier Patient Education  2021 Elsevier Inc.  Tadalafil tablets (Cialis) What is this medicine? TADALAFIL (tah DA la fil) is used to treat erection problems in men. It is also used for enlargement of the prostate gland in men, a condition called benign prostatic hyperplasia or BPH. This medicine improves urine flow and reduces BPH symptoms. This medicine can also treat both erection problems and BPH when they occur together. This medicine may be used for other purposes; ask your health care provider or pharmacist if you have questions. COMMON BRAND NAME(S): Adcirca, ALYQ, Cialis What should I tell my health care provider before I take this medicine? They need to know if you have any of these conditions:  bleeding disorders  eye or vision problems, including a rare inherited eye disease called retinitis pigmentosa  anatomical deformation of the penis, Peyronie's disease, or history of priapism (painful and prolonged erection)  heart disease, angina, a history of heart attack, irregular heart beats, or other heart problems  high or low blood pressure  history of blood diseases, like sickle cell anemia or leukemia  history of stomach bleeding  kidney disease  liver disease  stroke  an unusual or allergic reaction to tadalafil, other medicines,   foods, dyes, or preservatives  pregnant or trying to get pregnant  breast-feeding How should I use this medicine? Take this medicine by mouth with a glass of water. Follow the directions on the prescription label. You may take this medicine with or without meals. When this medicine is used for erection problems, your doctor may prescribe it to be taken once daily or as needed. If you are taking the medicine as needed, you may be able to have sexual activity 30 minutes after taking it and for  up to 36 hours after taking it. Whether you are taking the medicine as needed or once daily, you should not take more than one dose per day. If you are taking this medicine for symptoms of benign prostatic hyperplasia (BPH) or to treat both BPH and an erection problem, take the dose once daily at about the same time each day. Do not take your medicine more often than directed. Talk to your pediatrician regarding the use of this medicine in children. Special care may be needed. Overdosage: If you think you have taken too much of this medicine contact a poison control center or emergency room at once. NOTE: This medicine is only for you. Do not share this medicine with others. What if I miss a dose? If you are taking this medicine as needed for erection problems, this does not apply. If you miss a dose while taking this medicine once daily for an erection problem, benign prostatic hyperplasia, or both, take it as soon as you remember, but do not take more than one dose per day. What may interact with this medicine? Do not take this medicine with any of the following medications:  nitrates like amyl nitrite, isosorbide dinitrate, isosorbide mononitrate, nitroglycerin  other medicines for erectile dysfunction like avanafil, sildenafil, vardenafil  other tadalafil products (Adcirca)  riociguat This medicine may also interact with the following medications:  certain drugs for high blood pressure  certain drugs for the treatment of HIV infection or AIDS  certain drugs used for fungal or yeast infections, like fluconazole, itraconazole, ketoconazole, and voriconazole  certain drugs used for seizures like carbamazepine, phenytoin, and phenobarbital  grapefruit juice  macrolide antibiotics like clarithromycin, erythromycin, troleandomycin  medicines for prostate problems  rifabutin, rifampin or rifapentine This list may not describe all possible interactions. Give your health care provider a  list of all the medicines, herbs, non-prescription drugs, or dietary supplements you use. Also tell them if you smoke, drink alcohol, or use illegal drugs. Some items may interact with your medicine. What should I watch for while using this medicine? If you notice any changes in your vision while taking this drug, call your doctor or health care professional as soon as possible. Stop using this medicine and call your health care provider right away if you have a loss of sight in one or both eyes. Contact your doctor or health care professional right away if the erection lasts longer than 4 hours or if it becomes painful. This may be a sign of serious problem and must be treated right away to prevent permanent damage. If you experience symptoms of nausea, dizziness, chest pain or arm pain upon initiation of sexual activity after taking this medicine, you should refrain from further activity and call your doctor or health care professional as soon as possible. Do not drink alcohol to excess (examples, 5 glasses of wine or 5 shots of whiskey) when taking this medicine. When taken in excess, alcohol can increase your chances   of getting a headache or getting dizzy, increasing your heart rate or lowering your blood pressure. Using this medicine does not protect you or your partner against HIV infection (the virus that causes AIDS) or other sexually transmitted diseases. What side effects may I notice from receiving this medicine? Side effects that you should report to your doctor or health care professional as soon as possible:  allergic reactions like skin rash, itching or hives, swelling of the face, lips, or tongue  breathing problems  changes in hearing  changes in vision  chest pain  fast, irregular heartbeat  prolonged or painful erection  seizures Side effects that usually do not require medical attention (report to your doctor or health care professional if they continue or are  bothersome):  back pain  dizziness  flushing  headache  indigestion  muscle aches  nausea  stuffy or runny nose This list may not describe all possible side effects. Call your doctor for medical advice about side effects. You may report side effects to FDA at 1-800-FDA-1088. Where should I keep my medicine? Keep out of the reach of children. Store at room temperature between 15 and 30 degrees C (59 and 86 degrees F). Throw away any unused medicine after the expiration date. NOTE: This sheet is a summary. It may not cover all possible information. If you have questions about this medicine, talk to your doctor, pharmacist, or health care provider.  2021 Elsevier/Gold Standard (2013-07-03 13:15:49)  

## 2020-06-21 NOTE — Progress Notes (Signed)
   06/21/20 1:14 PM   Guy Pearson March 25, 1990 [id number]  CC: Erectile dysfunction, low testosterone  HPI: I saw Guy Pearson for the above issues.  He is a 30 year old male with morbid obesity and BMI of 45, hypertension on amlodipine and metoprolol, and hypercholesterolemia who reports erectile dysfunction and low testosterone.  He has never tried any medications for this.  An AM testosterone with PCP on 06/06/2020 was low at 180.  He is starting to work on some diet changes.  He does not smoke.  He is married with 1 daughter, and he and his wife are interested in further pregnancies, and currently trying to get pregnant.  She has PCOS, and they have had significant challenges with pregnancies previously.  He was told once that he had low sperm count on a semen analysis.  He has been told to have a sleep apnea study previously, but has not had this performed.   PMH: Past Medical History:  Diagnosis Date  . Anxiety state, unspecified   . Hypertension   . Other injury of other sites of trunk   . Pain in limb   . Tachycardia, unspecified    Family History: Family History  Problem Relation Age of Onset  . Hypertension Mother   . Hypertension Father   . Arrhythmia Father        A-Fib  . Hyperlipidemia Paternal Grandfather   . Hypertension Paternal Grandfather   . Stroke Paternal Grandfather     Social History:  reports that he has never smoked. He has never used smokeless tobacco. He reports that he does not drink alcohol and does not use drugs.  Physical Exam: BP 118/74   Pulse 80   Ht 5\' 5"  (1.651 m)   Wt 271 lb (122.9 kg)   BMI 45.10 kg/m    Constitutional:  Alert and oriented, No acute distress.  Obese Cardiovascular: No clubbing, cyanosis, or edema. Respiratory: Normal respiratory effort, no increased work of breathing. GI: Abdomen is soft, nontender, nondistended, no abdominal masses GU: Buried penis, testicles 20 cc and descended bilaterally without  masses  Laboratory Data: Reviewed, see HPI  Assessment & Plan:   30 year old male with morbid obesity and BMI of 45, hypertension on amlodipine and metoprolol, with low testosterone of 180, decreased energy, weight gain, and erectile dysfunction.  Regarding his erectile dysfunction, we discussed lifestyle modifications extensively including exercise, weight loss, healthy eating, as well as the impact of blood pressure medications like metoprolol on ED.  I recommended a trial of Cialis 5 mg every other day to start.  Risks and benefits discussed.  In terms of the low testosterone, we again reviewed lifestyle modifications above, and I recommended a trial of Clomid 25 mg daily.  We discussed the impact of exogenous testosterone on fertility, and the importance of a strategy like Clomid that increases his body's natural testosterone production, and can potentially increase fertility.  Finally, I encouraged him to consider a sleep apnea study, as this could also contribute to his overall fatigue and symptoms.  -Trial of Cialis 5 mg every other day for ED, consider stopping metoprolol and finding an alternative blood pressure medication if refractory symptoms -Trial of Clomid 25 mg daily for low testosterone -RTC 6 weeks symptom check and repeat AM testosterone  38, MD 06/21/2020  [facility] [address], [address] [place], [place] [phone]

## 2020-06-22 ENCOUNTER — Telehealth: Payer: Self-pay

## 2020-06-22 NOTE — Telephone Encounter (Signed)
Pt advised that Clomid is unlikely to be covered, however PA was initiated via cover my meds. Awaiting response. PA Case ID- [id number]. RX [id number].

## 2020-06-28 NOTE — Telephone Encounter (Signed)
Incoming approval for Clomid 50mg  tablets.  PA dates: 06/22/20 - 06/22/21

## 2020-08-02 ENCOUNTER — Encounter: Payer: Self-pay | Admitting: Urology

## 2020-08-02 ENCOUNTER — Other Ambulatory Visit
Admission: RE | Admit: 2020-08-02 | Discharge: 2020-08-02 | Disposition: A | Discharge status: Home / Self Care | Payer: BC Managed Care – PPO | Attending: Urology | Admitting: Urology

## 2020-08-02 ENCOUNTER — Other Ambulatory Visit: Payer: Self-pay

## 2020-08-02 ENCOUNTER — Ambulatory Visit: Payer: BC Managed Care – PPO | Admitting: Urology

## 2020-08-02 VITALS — BP 137/81 | HR 80 | Ht 65.0 in | Wt 274.0 lb

## 2020-08-02 DIAGNOSIS — R7989 Other specified abnormal findings of blood chemistry: Secondary | ICD-10-CM | POA: Insufficient documentation

## 2020-08-02 DIAGNOSIS — R6882 Decreased libido: Secondary | ICD-10-CM | POA: Diagnosis not present

## 2020-08-02 DIAGNOSIS — N529 Male erectile dysfunction, unspecified: Secondary | ICD-10-CM | POA: Diagnosis not present

## 2020-08-02 NOTE — Progress Notes (Signed)
   08/02/2020 10:30 AM   [name] [birth year] [id number]  Reason for visit: Follow up ED, low testosterone, low libido  HPI: 30 year old male with morbid obesity, BMI of 45, hypertension on amlodipine and metoprolol, and hypercholesterolemia with the above issues.  He and his wife are interested in having additional children.  Testosterone was low at 180.  At our last visit on 06/21/2020 we trialed Cialis 5 mg every other day for ED, and Clomid 25 mg daily for the low testosterone and low libido.  He has noticed significant improvement in the erections on the Cialis 5 mg every other day, and denies any problems with ED at this time.  He has not noticed any significant improvement in his sex drive and libido on the Clomid thus far.  Testosterone was drawn this morning and is still pending.  We discussed that low libido and energy are multifactorial, and factors clinic include poor sleep, obesity, stress/anxiety.  He is also considering being evaluated for sleep apnea, and I encouraged him to follow through with this.  -Will call with testosterone results, if normalized on the Clomid, continue Clomid and work on additional strategies for the low libido.  -Continue Cialis   Sondra Come, MD  [facility] [address] [place], [place] [address] [phone]

## 2020-08-02 NOTE — Patient Instructions (Signed)
[facility] (11th ed., pp. 538-555). [place], [place]: Elsevier.">  Testosterone Replacement Therapy Testosterone replacement therapy (TRT) treats men who have a low testosterone level. Testosterone is a male hormone that is produced in the testicles. It is responsible for typical male characteristics and for maintaining a man's sex drive and ability to get an erection. Testosterone also supports bone and muscle health. Low testosterone may not need to be treated. Your health care provider may recommend TRT if you have symptoms such as a low sex drive or erection problems, weak muscles or bones, or low energy. Types of TRT You and your health care provider will decide which form is best for you. TRT is available in the following forms:  Topical gels, creams, lotions, or sprays. Do not let other people, especially women or children, come in contact with the skin where the testosterone was applied.  Nasal gels.  Patches.  Pills.  Injections into the muscle or under the skin.  Long-acting pellets inserted under the skin. The amount of TRT you take and how long you take it is based on your condition. It is important to:  Begin TRT with the lowest possible dosage.  Stop TRT if your health care provider tells you to stop.  Work with your health care provider so that you feel informed and comfortable with your decision.   Tell a health care provider about:  Any allergies you have.  Any personal or family history of blood clots, breast cancer, or prostate cancer.  If you have sleep apnea or have been told that you snore.  All medicines you are taking, including vitamins, herbs, eye drops, creams, and over-the-counter medicines.  Any surgeries you have had.  Any other medical conditions you have.  If you wish to have biological children someday. What are the benefits? Benefits of TRT vary but may include improved sexual function, muscle mass or strength, mood, or quality of  life. What are the risks? Risks of TRT vary depending on your individual and medical history. Side effects can be related to the type of TRT you choose. If you choose products that are used on the skin, you may have skin irritation. If you get injections, you may have redness and swelling at the injection site. Side effects that can happen with any form of TRT include:  Lower sperm count.  Acne.  Swelling of your legs or feet, or tenderness in the chest or breast area.  Sleep disturbances and mood swings.  Enlarged prostate.  Increase in your red blood count. It is unclear if testosterone increases the risk of some serious conditions. Talk with your health care provider about your risk for these conditions, including:  Blood clots.  Heart disease, such as stroke or heart attack.  Prostate cancer. Risks of TRT may increase if you:  Have had or are at risk for prostate cancer or breast cancer.  Have had a previous stroke or heart attack.  Have a high number of red blood cells.  Have treated or untreated sleep apnea.  Have a large prostate. The long-term safety of TRT is not known. Follow these instructions at home:  Take over-the-counter and prescription medicines only as told by your health care provider.  Lose weight if you are overweight. Ask your health care provider to help you start a healthy diet and exercise program to reach and maintain a healthy weight.  Work with your health care provider to manage other medical conditions that may lower your testosterone. These include   obesity, high blood pressure, high cholesterol, diabetes, liver disease, kidney disease, and sleep apnea.  Do not use any testosterone replacement therapies that are not prescribed by your health care provider.  Keep all follow-up visits. This is important. Where to find more information  [employer]: www.urologyhealth.org  Endocrine Society: www.hormone.org Contact a  health care provider if:  You have side effects from your TRT.  You have pain or swelling in your legs.  You have problems urinating.  You have lumps or changes in your breasts or armpits. Get help right away if:  You have shortness of breath.  You have chest pain.  You have slurred speech.  You have weakness or numbness in any part of your arms or legs. These symptoms may represent a serious problem that is an emergency. Do not wait to see if the symptoms will go away. Get medical help right away. Call your local emergency services (911 in the [place] Do not drive yourself to the hospital. Summary  Testosterone replacement therapy (TRT) is used to treat men who have a low testosterone level.  TRT should only be prescribed by and under the supervision of a health care provider.  Tell a health care provider about any medical conditions you have.  TRT may have side effects.  Talk with your health care provider about all of the risks and benefits before you start therapy. This information is not intended to replace advice given to you by your health care provider. Make sure you discuss any questions you have with your health care provider. Document Revised: 12/08/2019 Document Reviewed: 12/08/2019 Elsevier Patient Education  2021 Elsevier Inc.  

## 2020-08-03 ENCOUNTER — Telehealth: Payer: Self-pay

## 2020-08-03 DIAGNOSIS — E291 Testicular hypofunction: Secondary | ICD-10-CM

## 2020-08-03 LAB — TESTOSTERONE: Testosterone: 674 ng/dL (ref 264–916)

## 2020-08-03 NOTE — Telephone Encounter (Signed)
See mychart message, appt scheduled.

## 2020-08-03 NOTE — Telephone Encounter (Signed)
-----   Message from Sondra Come, MD sent at 08/03/2020  8:29 AM EDT ----- Good news, testosterone increased all the way to 674 from 180 after starting the Clomid.  He should continue this medication.  RTC 6 months symptom check with AM testosterone prior  Legrand Rams, MD 08/03/2020

## 2021-02-08 ENCOUNTER — Encounter: Payer: Self-pay | Admitting: Urology

## 2021-02-08 ENCOUNTER — Ambulatory Visit: Payer: BC Managed Care – PPO | Admitting: Urology

## 2021-02-08 ENCOUNTER — Other Ambulatory Visit: Payer: Self-pay

## 2021-02-08 VITALS — BP 115/73 | HR 60 | Ht 65.0 in

## 2021-02-08 DIAGNOSIS — N529 Male erectile dysfunction, unspecified: Secondary | ICD-10-CM

## 2021-02-08 DIAGNOSIS — R7989 Other specified abnormal findings of blood chemistry: Secondary | ICD-10-CM | POA: Diagnosis not present

## 2021-02-08 MED ORDER — CLOMIPHENE CITRATE 50 MG PO TABS: 25.0000 mg | ORAL_TABLET | Freq: Every day | ORAL | 6 refills | Status: DC

## 2021-02-08 NOTE — Addendum Note (Signed)
Addended by: Frankey Shown on: 02/08/2021 09:24 AM   Modules accepted: Orders

## 2021-02-08 NOTE — Progress Notes (Signed)
° °  02/08/2021 9:17 AM   [name] [birth year] [id number]  Reason for visit: Follow up ED, low testosterone, low libido  HPI: 30 year old male with morbid obesity, hypertension on amlodipine and metoprolol, and hypercholesterolemia with the above issues.  He and his wife are interested in having additional children.  Testosterone initially was low at 180, and we opted for a trial of Clomid 25 mg daily as well as Cialis.  He had a significant increase in the testosterone to 674 with improvement in his libido and energy.  The Cialis was working well, but he has found that he does not need that medication anymore now that the testosterone has normalized.  He and his wife are still interested in having children.  I reviewed the testosterone lab, and recent CMP with normal liver function.  I recommended continuing the Clomid, and taking the Cialis as needed, with follow-up in 6 months with repeat testosterone, H/H, and CMP.    Sondra Come, MD  [facility] [address], [address] [place], [place] [address] [phone]

## 2021-06-28 ENCOUNTER — Telehealth: Payer: Self-pay

## 2021-06-28 NOTE — Telephone Encounter (Signed)
PA submitted via covermymeds for Cloimid 50mg . Awaiting response.  ?

## 2021-06-29 NOTE — Telephone Encounter (Signed)
Incoming PA approval from [employer] for Clomid.  ? ?Dates: 06/28/21 - 5/324  ?

## 2021-08-04 ENCOUNTER — Other Ambulatory Visit: Payer: BC Managed Care – PPO

## 2021-08-04 ENCOUNTER — Other Ambulatory Visit
Admission: RE | Admit: 2021-08-04 | Discharge: 2021-08-04 | Disposition: A | Discharge status: Home / Self Care | Payer: BC Managed Care – PPO | Attending: Urology | Admitting: Urology

## 2021-08-04 ENCOUNTER — Other Ambulatory Visit: Payer: Self-pay | Admitting: *Deleted

## 2021-08-04 ENCOUNTER — Other Ambulatory Visit
Admission: RE | Admit: 2021-08-04 | Discharge: 2021-08-04 | Disposition: A | Discharge status: Home / Self Care | Payer: BC Managed Care – PPO | Attending: Family Medicine | Admitting: Family Medicine

## 2021-08-04 DIAGNOSIS — E785 Hyperlipidemia, unspecified: Secondary | ICD-10-CM | POA: Insufficient documentation

## 2021-08-04 DIAGNOSIS — R6882 Decreased libido: Secondary | ICD-10-CM

## 2021-08-04 DIAGNOSIS — E291 Testicular hypofunction: Secondary | ICD-10-CM | POA: Insufficient documentation

## 2021-08-04 DIAGNOSIS — R7989 Other specified abnormal findings of blood chemistry: Secondary | ICD-10-CM | POA: Insufficient documentation

## 2021-08-04 LAB — COMPREHENSIVE METABOLIC PANEL
ALT: 27 U/L (ref 0–44)
AST: 24 U/L (ref 15–41)
Albumin: 4.3 g/dL (ref 3.5–5.0)
Alkaline Phosphatase: 41 U/L (ref 38–126)
Anion gap: 7 (ref 5–15)
BUN: 16 mg/dL (ref 6–20)
CO2: 26 mmol/L (ref 22–32)
Calcium: 9.2 mg/dL (ref 8.9–10.3)
Chloride: 105 mmol/L (ref 98–111)
Creatinine, Ser: 0.9 mg/dL (ref 0.61–1.24)
GFR, Estimated: >60 mL/min (ref >60)
Glucose, Bld: 103 mg/dL — ABNORMAL HIGH (ref 70–99)
Potassium: 4 mmol/L (ref 3.5–5.1)
Sodium: 138 mmol/L (ref 135–145)
Total Bilirubin: 0.5 mg/dL (ref 0.3–1.2)
Total Protein: 7.4 g/dL (ref 6.5–8.1)

## 2021-08-04 LAB — LIPID PANEL
Cholesterol: 213 mg/dL — ABNORMAL HIGH (ref 0–200)
HDL: 46 mg/dL (ref >40)
LDL Cholesterol: 147 mg/dL — ABNORMAL HIGH (ref 0–99)
Total CHOL/HDL Ratio: 4.6 RATIO
Triglycerides: 101 mg/dL (ref <150)
VLDL: 20 mg/dL (ref 0–40)

## 2021-08-04 LAB — HEMOGLOBIN AND HEMATOCRIT, BLOOD
HCT: 46.1 % (ref 39.0–52.0)
Hemoglobin: 16.1 g/dL (ref 13.0–17.0)

## 2021-08-05 LAB — TESTOSTERONE: Testosterone: 740 ng/dL (ref 264–916)

## 2021-08-09 ENCOUNTER — Ambulatory Visit: Payer: BC Managed Care – PPO | Admitting: Urology

## 2021-08-09 ENCOUNTER — Encounter: Payer: Self-pay | Admitting: Urology

## 2021-08-09 VITALS — BP 138/73 | HR 73 | Ht 65.0 in | Wt 259.2 lb

## 2021-08-09 DIAGNOSIS — R300 Dysuria: Secondary | ICD-10-CM | POA: Diagnosis not present

## 2021-08-09 DIAGNOSIS — N529 Male erectile dysfunction, unspecified: Secondary | ICD-10-CM

## 2021-08-09 DIAGNOSIS — E291 Testicular hypofunction: Secondary | ICD-10-CM | POA: Diagnosis not present

## 2021-08-09 DIAGNOSIS — R7989 Other specified abnormal findings of blood chemistry: Secondary | ICD-10-CM

## 2021-08-09 MED ORDER — CLOMIPHENE CITRATE 50 MG PO TABS: 25.0000 mg | ORAL_TABLET | Freq: Every day | ORAL | 6 refills | Status: DC

## 2021-08-09 NOTE — Progress Notes (Signed)
   08/09/2021 11:41 AM   Guy Pearson [birth year] [id number]  Reason for visit: Follow up ED, low testosterone, low libido, urinary symptoms  HPI: 31 year old male with morbid obesity, hypertension on amlodipine and metoprolol, and hypercholesterolemia with the above issues.  He and his wife were interested in having additional children.  Testosterone initially was low at 180, and we opted for a trial of Clomid 25 mg daily as well as Cialis.  He had a significant increase in the testosterone to 674 with improvement in his libido and energy, and repeat testosterone today remains excellent at 740 on the Clomid.   Unfortunately, it sounds like he is separated at this time, but he would like to continue the Clomid as it significantly improves his energy and was helping with weight loss as well.  He is not using the Cialis as he is not sexually active at this time.  CMP and H/H from 08/04/2021 normal.  He also reports some mild and intermittent dysuria over the last week.  He was unable to void for urinalysis today.  A urinalysis was ordered, and he will try to drop off a sample in the next few weeks.  Clomid refilled, can continue Cialis as needed, RTC 1 year CMP and testosterone Urinalysis lab ordered   Sondra Come, MD  [facility] [address], [address] [place], [place] [address] [phone]

## 2022-06-05 ENCOUNTER — Ambulatory Visit (INDEPENDENT_AMBULATORY_CARE_PROVIDER_SITE_OTHER): Payer: BC Managed Care – PPO | Admitting: Urology

## 2022-06-05 ENCOUNTER — Encounter: Payer: Self-pay | Admitting: Urology

## 2022-06-05 VITALS — BP 125/80 | HR 87 | Ht 65.0 in | Wt 257.0 lb

## 2022-06-05 DIAGNOSIS — E291 Testicular hypofunction: Secondary | ICD-10-CM | POA: Diagnosis not present

## 2022-06-05 DIAGNOSIS — N529 Male erectile dysfunction, unspecified: Secondary | ICD-10-CM | POA: Diagnosis not present

## 2022-06-05 DIAGNOSIS — R7989 Other specified abnormal findings of blood chemistry: Secondary | ICD-10-CM

## 2022-06-05 MED ORDER — TADALAFIL 5 MG PO TABS: 5.0000 mg | ORAL_TABLET | Freq: Every day | ORAL | 4 refills | Status: DC | PRN

## 2022-06-05 MED ORDER — CLOMIPHENE CITRATE 50 MG PO TABS: 25.0000 mg | ORAL_TABLET | Freq: Every day | ORAL | 6 refills | Status: DC

## 2022-06-05 NOTE — Progress Notes (Signed)
   06/05/2022 3:06 PM   Guy Pearson [birth year] [id number]  Reason for visit: Follow up ED, low testosterone, urinary symptoms  HPI: 32 year old male with morbid obesity, hypertension on amlodipine and metoprolol, anxiety/depression on SSRIs, who we have followed for the above issues.  He is still interested in having additional children.  He has a history of a low testosterone at 180, and opted for a trial of Clomid 25 mg daily and 5 mg Cialis for his ED.  He had significant improvement in the testosterone to ~700 on Clomid.    He has gone through a lot over the last year.  He is going through a divorce, and is also recently diagnosed with ADHD, his anxiety/depression is also worsened.  He recently had a new sexual partner and felt the Cialis did not work as well as usual.  We discussed that erections are multifactorial and psychogenic ED likely a large contributor with his multiple other issues right now.  We also discussed behavioral strategies including weight loss and exercise.  He had questions about changing to testosterone replacement, but again I reminded him this would cause infertility and potentially long-term problems with having children in the future and he would like to remain on the Clomid to preserve fertility.  He reports some intermittent postvoid dribbling.  I recommended focusing on weight loss and exercise  Morning testosterone lab, call with results Cialis and Clomid refilled   Sondra Come, MD  [facility] [address], [address] [place], [place] [address] [phone]

## 2022-06-06 ENCOUNTER — Other Ambulatory Visit
Admission: RE | Admit: 2022-06-06 | Discharge: 2022-06-06 | Disposition: A | Discharge status: Home / Self Care | Payer: BC Managed Care – PPO | Attending: Urology | Admitting: Urology

## 2022-06-06 DIAGNOSIS — E291 Testicular hypofunction: Secondary | ICD-10-CM | POA: Diagnosis present

## 2022-06-06 DIAGNOSIS — R7989 Other specified abnormal findings of blood chemistry: Secondary | ICD-10-CM

## 2022-06-06 LAB — COMPREHENSIVE METABOLIC PANEL
ALT: 29 U/L (ref 0–44)
AST: 27 U/L (ref 15–41)
Albumin: 4.3 g/dL (ref 3.5–5.0)
Alkaline Phosphatase: 46 U/L (ref 38–126)
Anion gap: 14 (ref 5–15)
BUN: 14 mg/dL (ref 6–20)
CO2: 19 mmol/L — ABNORMAL LOW (ref 22–32)
Calcium: 9.1 mg/dL (ref 8.9–10.3)
Chloride: 99 mmol/L (ref 98–111)
Creatinine, Ser: 0.95 mg/dL (ref 0.61–1.24)
GFR, Estimated: >60 mL/min (ref >60)
Glucose, Bld: 102 mg/dL — ABNORMAL HIGH (ref 70–99)
Potassium: 4.1 mmol/L (ref 3.5–5.1)
Sodium: 132 mmol/L — ABNORMAL LOW (ref 135–145)
Total Bilirubin: 0.8 mg/dL (ref 0.3–1.2)
Total Protein: 7.5 g/dL (ref 6.5–8.1)

## 2022-06-07 ENCOUNTER — Other Ambulatory Visit: Payer: Self-pay

## 2022-06-07 DIAGNOSIS — R7989 Other specified abnormal findings of blood chemistry: Secondary | ICD-10-CM

## 2022-06-07 LAB — TESTOSTERONE: Testosterone: 744 ng/dL (ref 264–916)

## 2022-06-30 ENCOUNTER — Other Ambulatory Visit: Payer: Self-pay | Admitting: Urology

## 2022-06-30 DIAGNOSIS — R7989 Other specified abnormal findings of blood chemistry: Secondary | ICD-10-CM

## 2022-07-05 ENCOUNTER — Telehealth: Payer: Self-pay

## 2022-07-05 NOTE — Telephone Encounter (Signed)
PA for Clomid approved via cover my meds.   Dates: 07/04/22 - 07/04/23

## 2022-07-29 NOTE — Progress Notes (Unsigned)
07/30/2022 2:57 PM   Guy Pearson March 31, 1990 [id number]  Referring provider: Marina Goodell, MD [address] [place],  [place] [address]  Urological history: 1. Erectile dysfunction -contributing factors of obesity, HTN, anxiety, depression and HLD -tadalafil 5 mg, on-demand-dosing  2. Hypogonadism -contributing factors of obesity -testosterone level (05/2022) 744 -Clomid 50 mg, 1/2 tablet daily  3. Lower urinary symptoms -contributing factors of obesity and Cymbalta   Chief Complaint  Patient presents with   Groin Pain   HPI: Guy Pearson is a 32 y.o. male who presents today for groin pain.   Previous records reviewed.   He states he has been having this right groin discomfort for the last several years, but over the last few weeks it has become more intense.  The pain is in the right groin and radiates into the inner thigh down into the right testicle.  It was very intense 2 weeks ago.  He was seen at [facility] urgent care and they told him they had a trace blood on urinalysis.  I cannot see those records at this time.  They scheduled him for an ultrasound of his groin, but they stated they could not completed until July.  The morning he went to the urgent care he felt that his right testicle was bigger, but it was not appreciated on exam while he was at urgent care.   He continues to have urinary hesitancy and feelings of incomplete bladder emptying.  He also finds that having erections aggravates this pain as well.  He is not having symptoms at this time.   UA yellow clear, specific gravity 1.020, pH 6.5, 250 glucose, mucus present, hyaline cast present and 0-5 WBCs  PMH: Past Medical History:  Diagnosis Date   Anxiety state, unspecified    Hypertension    Other injury of other sites of trunk    Pain in limb    Tachycardia, unspecified     Surgical History: No past surgical history on file.  Home Medications:  Allergies as of 07/30/2022        Reactions   Menthol Rash   "really bad rash"   Duloxetine Other (See Comments)   Patient states this medication changes his character and do things he would not normally do.   Venlafaxine Other (See Comments)   Patient states this medication makes him do things he would not normally do and act out of character.        Medication List        Accurate as of July 30, 2022  2:57 PM. If you have any questions, ask your nurse or doctor.          STOP taking these medications    diazepam 5 MG tablet Commonly known as: VALIUM Stopped by: Michiel Cowboy, PA-C       TAKE these medications    acetaminophen 500 MG tablet Commonly known as: TYLENOL Take 500-1,000 mg by mouth 3 (three) times daily as needed.   amLODipine 2.5 MG tablet Commonly known as: NORVASC Take 2.5 mg by mouth daily.   busPIRone 10 MG tablet Commonly known as: BUSPAR Take 10 mg by mouth 2 (two) times daily.   clomiPHENE 50 MG tablet Commonly known as: CLOMID Take 0.5 tablets (25 mg total) by mouth daily.   DULoxetine 20 MG capsule Commonly known as: CYMBALTA Take 20 mg by mouth daily.   FLUoxetine 40 MG capsule Commonly known as: PROZAC Take 40 mg by mouth  daily.   fluticasone 50 MCG/ACT nasal spray Commonly known as: FLONASE Place into both nostrils.   hydrOXYzine 25 MG tablet Commonly known as: ATARAX Take 25 mg by mouth 3 (three) times daily.   metoprolol tartrate 25 MG tablet Commonly known as: LOPRESSOR Take 25 mg by mouth 2 (two) times daily.   pantoprazole 40 MG tablet Commonly known as: PROTONIX Take 1 tablet by mouth daily.   tadalafil 5 MG tablet Commonly known as: CIALIS Take 1-2 tablets (5-10 mg total) by mouth daily as needed for erectile dysfunction.   valACYclovir 1000 MG tablet Commonly known as: VALTREX TAKE 2 TABLETS BY MOUTH 2 TIMES DAILY FOR 1 DAY AS NEEDED FOR FLARES        Allergies:  Allergies  Allergen Reactions   Menthol Rash    "really bad rash"    Duloxetine Other (See Comments)    Patient states this medication changes his character and do things he would not normally do.   Venlafaxine Other (See Comments)    Patient states this medication makes him do things he would not normally do and act out of character.    Family History: Family History  Problem Relation Age of Onset   Hypertension Mother    Hypertension Father    Arrhythmia Father        A-Fib   Hyperlipidemia Paternal Grandfather    Hypertension Paternal Grandfather    Stroke Paternal Grandfather     Social History:  reports that he has never smoked. He has never been exposed to tobacco smoke. He has never used smokeless tobacco. He reports that he does not drink alcohol and does not use drugs.  ROS: Pertinent ROS in HPI  Physical Exam: BP 124/81 (BP Location: Left Arm, Patient Position: Sitting, Cuff Size: Large)   Pulse 87   Ht 5\' 5"  (1.651 m)   Wt 239 lb (108.4 kg)   BMI 39.77 kg/m   Constitutional:  Well nourished. Alert and oriented, No acute distress. HEENT: [place] AT, moist mucus membranes.  Trachea midline Cardiovascular: No clubbing, cyanosis, or edema. Respiratory: Normal respiratory effort, no increased work of breathing. GU: No CVA tenderness.  No bladder fullness or masses.  Patient with uncircumcised phallus.  Foreskin easily retracted.  Urethral meatus is patent.  No penile discharge. No penile lesions or rashes. Scrotum without lesions, cysts, rashes and/or edema.  Testicles are located scrotally bilaterally. No masses are appreciated in the testicles. Left and right epididymis are normal. Neurologic: Grossly intact, no focal deficits, moving all 4 extremities. Psychiatric: Normal mood and affect.  Laboratory Data: Lab Results  Component Value Date   CREATININE 1.02 07/30/2022   Lab Results  Component Value Date   TESTOSTERONE 744 06/06/2022      Component Value Date/Time   CHOL 213 (H) 08/04/2021 0925   HDL 46 08/04/2021 0925    CHOLHDL 4.6 08/04/2021 0925   VLDL 20 08/04/2021 0925   LDLCALC 147 (H) 08/04/2021 0925   Lab Results  Component Value Date   AST 24 07/30/2022   Lab Results  Component Value Date   ALT 26 07/30/2022  I have reviewed the labs.   Pertinent Imaging: N/A  Assessment & Plan:    1. Groin pain  -UA benign -urine culture is pending -No findings on exam to explain his symptoms -Will proceed with CT of the abdomen and pelvis for further evaluation  Return in about 2 weeks (around 08/13/2022) for CT results .  These notes generated with  voice recognition software. I apologize for typographical errors.  Cloretta Ned  [facility] [address]  [address] [place], [place] [address] [phone]

## 2022-07-30 ENCOUNTER — Ambulatory Visit: Payer: BC Managed Care – PPO | Admitting: Urology

## 2022-07-30 ENCOUNTER — Other Ambulatory Visit: Payer: Self-pay

## 2022-07-30 ENCOUNTER — Other Ambulatory Visit
Admission: RE | Admit: 2022-07-30 | Discharge: 2022-07-30 | Disposition: A | Discharge status: Home / Self Care | Payer: BC Managed Care – PPO | Attending: Urology | Admitting: Urology

## 2022-07-30 ENCOUNTER — Encounter: Payer: Self-pay | Admitting: Urology

## 2022-07-30 VITALS — BP 124/81 | HR 87 | Ht 65.0 in | Wt 239.0 lb

## 2022-07-30 DIAGNOSIS — R1031 Right lower quadrant pain: Secondary | ICD-10-CM | POA: Diagnosis not present

## 2022-07-30 DIAGNOSIS — R7989 Other specified abnormal findings of blood chemistry: Secondary | ICD-10-CM

## 2022-07-30 DIAGNOSIS — R1030 Lower abdominal pain, unspecified: Secondary | ICD-10-CM

## 2022-07-30 LAB — URINALYSIS, COMPLETE (UACMP) WITH MICROSCOPIC
Bacteria, UA: NONE SEEN
Bilirubin Urine: NEGATIVE
Glucose, UA: 250 mg/dL — AB
Hgb urine dipstick: NEGATIVE
Ketones, ur: NEGATIVE mg/dL
Leukocytes,Ua: NEGATIVE
Nitrite: NEGATIVE
Protein, ur: NEGATIVE mg/dL
RBC / HPF: NONE SEEN RBC/hpf (ref 0–5)
Specific Gravity, Urine: 1.02 (ref 1.005–1.030)
Squamous Epithelial / HPF: NONE SEEN /HPF (ref 0–5)
pH: 6.5 (ref 5.0–8.0)

## 2022-07-30 LAB — COMPREHENSIVE METABOLIC PANEL
ALT: 26 U/L (ref 0–44)
AST: 24 U/L (ref 15–41)
Albumin: 4.1 g/dL (ref 3.5–5.0)
Alkaline Phosphatase: 43 U/L (ref 38–126)
Anion gap: 8 (ref 5–15)
BUN: 14 mg/dL (ref 6–20)
CO2: 26 mmol/L (ref 22–32)
Calcium: 9.2 mg/dL (ref 8.9–10.3)
Chloride: 107 mmol/L (ref 98–111)
Creatinine, Ser: 1.02 mg/dL (ref 0.61–1.24)
GFR, Estimated: >60 mL/min (ref >60)
Glucose, Bld: 104 mg/dL — ABNORMAL HIGH (ref 70–99)
Potassium: 4.1 mmol/L (ref 3.5–5.1)
Sodium: 141 mmol/L (ref 135–145)
Total Bilirubin: 0.7 mg/dL (ref 0.3–1.2)
Total Protein: 7.5 g/dL (ref 6.5–8.1)

## 2022-07-31 LAB — URINE CULTURE: Culture: NO GROWTH

## 2022-08-01 LAB — TESTOSTERONE: Testosterone: 812 ng/dL (ref 264–916)

## 2022-08-10 ENCOUNTER — Ambulatory Visit
Admission: RE | Admit: 2022-08-10 | Discharge: 2022-08-10 | Disposition: A | Discharge status: Home / Self Care | Payer: BC Managed Care – PPO | Source: Ambulatory Visit | Attending: Urology | Admitting: Urology

## 2022-08-10 DIAGNOSIS — R1031 Right lower quadrant pain: Secondary | ICD-10-CM | POA: Diagnosis present

## 2022-08-10 NOTE — Progress Notes (Signed)
08/13/2022 10:03 PM   Guy Pearson 1990-12-05 [id number]  Referring provider: Marina Goodell, MD [address] [place],  [place] [address]  Urological history: 1. Erectile dysfunction -contributing factors of obesity, HTN, anxiety, depression and HLD -tadalafil 5 mg, on-demand-dosing  2. Hypogonadism -contributing factors of obesity -testosterone level (05/2022) 744 -Clomid 50 mg, 1/2 tablet daily  3. Lower urinary symptoms -contributing factors of obesity and Cymbalta   No chief complaint on file.  HPI: Guy Pearson is a 32 y.o. male who presents today for groin pain.   Previous records reviewed.   At his visit on 07/30/2022, he states he has been having this right groin discomfort for the last several years, but over the last few weeks it has become more intense.  The pain is in the right groin and radiates into the inner thigh down into the right testicle.  It was very intense 2 weeks ago.  He was seen at [facility] urgent care and they told him they had a trace blood on urinalysis.  I cannot see those records at this time.  They scheduled him for an ultrasound of his groin, but they stated they could not completed until July.  The morning he went to the urgent care he felt that his right testicle was bigger, but it was not appreciated on exam while he was at urgent care.   He continues to have urinary hesitancy and feelings of incomplete bladder emptying.  He also finds that having erections aggravates this pain as well.  He is not having symptoms at this time. UA yellow clear, specific gravity 1.020, pH 6.5, 250 glucose, mucus present, hyaline cast present and 0-5 WBCs. Urine culture was negative.   CT performed on 08/10/2022 did not reveal any urological causes for his pain, but radiologist interpretation is still pending.   PMH: Past Medical History:  Diagnosis Date   Anxiety state, unspecified    Hypertension    Other injury of other sites of trunk    Pain  in limb    Tachycardia, unspecified     Surgical History: No past surgical history on file.  Home Medications:  Allergies as of 08/13/2022       Reactions   Menthol Rash   "really bad rash"   Duloxetine Other (See Comments)   Patient states this medication changes his character and do things he would not normally do.   Venlafaxine Other (See Comments)   Patient states this medication makes him do things he would not normally do and act out of character.        Medication List        Accurate as of August 10, 2022 10:03 PM. If you have any questions, ask your nurse or doctor.          acetaminophen 500 MG tablet Commonly known as: TYLENOL Take 500-1,000 mg by mouth 3 (three) times daily as needed.   amLODipine 2.5 MG tablet Commonly known as: NORVASC Take 2.5 mg by mouth daily.   busPIRone 10 MG tablet Commonly known as: BUSPAR Take 10 mg by mouth 2 (two) times daily.   clomiPHENE 50 MG tablet Commonly known as: CLOMID Take 0.5 tablets (25 mg total) by mouth daily.   DULoxetine 20 MG capsule Commonly known as: CYMBALTA Take 20 mg by mouth daily.   FLUoxetine 40 MG capsule Commonly known as: PROZAC Take 40 mg by mouth daily.   fluticasone 50 MCG/ACT nasal spray Commonly known  as: FLONASE Place into both nostrils.   hydrOXYzine 25 MG tablet Commonly known as: ATARAX Take 25 mg by mouth 3 (three) times daily.   metoprolol tartrate 25 MG tablet Commonly known as: LOPRESSOR Take 25 mg by mouth 2 (two) times daily.   pantoprazole 40 MG tablet Commonly known as: PROTONIX Take 1 tablet by mouth daily.   tadalafil 5 MG tablet Commonly known as: CIALIS Take 1-2 tablets (5-10 mg total) by mouth daily as needed for erectile dysfunction.   valACYclovir 1000 MG tablet Commonly known as: VALTREX TAKE 2 TABLETS BY MOUTH 2 TIMES DAILY FOR 1 DAY AS NEEDED FOR FLARES        Allergies:  Allergies  Allergen Reactions   Menthol Rash    "really bad rash"    Duloxetine Other (See Comments)    Patient states this medication changes his character and do things he would not normally do.   Venlafaxine Other (See Comments)    Patient states this medication makes him do things he would not normally do and act out of character.    Family History: Family History  Problem Relation Age of Onset   Hypertension Mother    Hypertension Father    Arrhythmia Father        A-Fib   Hyperlipidemia Paternal Grandfather    Hypertension Paternal Grandfather    Stroke Paternal Grandfather     Social History:  reports that he has never smoked. He has never been exposed to tobacco smoke. He has never used smokeless tobacco. He reports that he does not drink alcohol and does not use drugs.  ROS: Pertinent ROS in HPI  Physical Exam: There were no vitals taken for this visit.  Constitutional:  Well nourished. Alert and oriented, No acute distress. HEENT: [place] AT, moist mucus membranes.  Trachea midline Cardiovascular: No clubbing, cyanosis, or edema. Respiratory: Normal respiratory effort, no increased work of breathing. Neurologic: Grossly intact, no focal deficits, moving all 4 extremities. Psychiatric: Normal mood and affect.   Laboratory Data: Lab Results  Component Value Date   CREATININE 1.02 07/30/2022   Lab Results  Component Value Date   TESTOSTERONE 812 07/30/2022      Component Value Date/Time   CHOL 213 (H) 08/04/2021 0925   HDL 46 08/04/2021 0925   CHOLHDL 4.6 08/04/2021 0925   VLDL 20 08/04/2021 0925   LDLCALC 147 (H) 08/04/2021 0925   Lab Results  Component Value Date   AST 24 07/30/2022   Lab Results  Component Value Date   ALT 26 07/30/2022  I have reviewed the labs.   Pertinent Imaging: CLINICAL DATA:  Abdominal pain/flank pain.   EXAM: CT ABDOMEN AND PELVIS WITHOUT CONTRAST   TECHNIQUE: Multidetector CT imaging of the abdomen and pelvis was performed following the standard protocol without IV contrast.    RADIATION DOSE REDUCTION: This exam was performed according to the departmental dose-optimization program which includes automated exposure control, adjustment of the mA and/or kV according to patient size and/or use of iterative reconstruction technique.   COMPARISON:  None Available.   FINDINGS: Lower chest: No acute abnormality.   Hepatobiliary: Mild hepatic steatosis. No focal liver lesion. Small stone identified within the gallbladder. No signs of gallbladder inflammation or bile duct dilatation.   Pancreas: Unremarkable. No pancreatic ductal dilatation or surrounding inflammatory changes.   Spleen: Normal in size without focal abnormality.   Adrenals/Urinary Tract: Normal adrenal glands. No nephrolithiasis, hydronephrosis or suspicious kidney mass. Small cyst off the posterior cortex  of the left kidney measuring 1 cm, image 34/2. No follow-up imaging recommended. No hydroureter or signs of ureteral lithiasis. Urinary bladder appears normal.   Stomach/Bowel: Stomach is within normal limits. Appendix appears normal. No evidence of bowel wall thickening, distention, or inflammatory changes.   Vascular/Lymphatic: Normal appearance of the abdominal aorta. No abdominopelvic adenopathy.   Reproductive: Prostate is unremarkable.   Other: No free fluid or fluid collections.   Musculoskeletal: No acute or significant osseous findings.   IMPRESSION: 1. No acute findings within the abdomen or pelvis. No kidney stones. 2. Hepatic steatosis. 3. Gallstone.     Electronically Signed   By: Signa Kell M.D.   On: 08/16/2022 07:06 I have independently reviewed the films.  See HPI.  Radiologist interpretation still pending.    Assessment & Plan:    1. Groin pain  -UA benign -urine culture negative -PE normal -CT scan negative -refer to PT  No follow-ups on file.  These notes generated with voice recognition software. I apologize for typographical errors.  Cloretta Ned  [facility] [address]  [address] [place], [place] [address] [phone]

## 2022-08-13 ENCOUNTER — Ambulatory Visit: Payer: BC Managed Care – PPO | Admitting: Urology

## 2022-08-13 ENCOUNTER — Encounter: Payer: Self-pay | Admitting: Urology

## 2022-08-13 VITALS — BP 115/79 | HR 67 | Ht 65.0 in | Wt 237.0 lb

## 2022-08-13 DIAGNOSIS — R1030 Lower abdominal pain, unspecified: Secondary | ICD-10-CM

## 2022-08-14 ENCOUNTER — Ambulatory Visit: Payer: BC Managed Care – PPO | Admitting: Urology

## 2023-03-06 ENCOUNTER — Other Ambulatory Visit: Payer: Self-pay

## 2023-03-06 DIAGNOSIS — R7989 Other specified abnormal findings of blood chemistry: Secondary | ICD-10-CM

## 2023-03-06 MED ORDER — CLOMIPHENE CITRATE 50 MG PO TABS: 25.0000 mg | ORAL_TABLET | Freq: Every day | ORAL | 6 refills | Status: DC

## 2023-06-10 ENCOUNTER — Other Ambulatory Visit: Payer: Self-pay

## 2023-06-10 DIAGNOSIS — E291 Testicular hypofunction: Secondary | ICD-10-CM

## 2023-06-10 DIAGNOSIS — R6882 Decreased libido: Secondary | ICD-10-CM

## 2023-06-11 ENCOUNTER — Encounter: Payer: Self-pay | Admitting: Urology

## 2023-06-11 ENCOUNTER — Ambulatory Visit: Payer: Self-pay | Admitting: Urology

## 2023-06-11 ENCOUNTER — Other Ambulatory Visit
Admission: RE | Admit: 2023-06-11 | Discharge: 2023-06-11 | Disposition: A | Discharge status: Home / Self Care | Attending: Urology | Admitting: Urology

## 2023-06-11 VITALS — BP 148/91 | Ht 65.0 in | Wt 249.0 lb

## 2023-06-11 DIAGNOSIS — N529 Male erectile dysfunction, unspecified: Secondary | ICD-10-CM

## 2023-06-11 DIAGNOSIS — E291 Testicular hypofunction: Secondary | ICD-10-CM | POA: Insufficient documentation

## 2023-06-11 DIAGNOSIS — R7989 Other specified abnormal findings of blood chemistry: Secondary | ICD-10-CM

## 2023-06-11 DIAGNOSIS — R6882 Decreased libido: Secondary | ICD-10-CM | POA: Insufficient documentation

## 2023-06-11 LAB — HEMOGLOBIN AND HEMATOCRIT, BLOOD
HCT: 45.1 % (ref 39.0–52.0)
Hemoglobin: 15.8 g/dL (ref 13.0–17.0)

## 2023-06-11 MED ORDER — TADALAFIL 5 MG PO TABS
5.0000 mg | ORAL_TABLET | Freq: Every day | ORAL | 4 refills | Status: AC | PRN
Start: 2023-06-11 — End: ?

## 2023-06-11 MED ORDER — TADALAFIL 5 MG PO TABS: 5.0000 mg | ORAL_TABLET | Freq: Every day | ORAL | 4 refills | Status: DC | PRN

## 2023-06-11 MED ORDER — CLOMIPHENE CITRATE 50 MG PO TABS: 25.0000 mg | ORAL_TABLET | Freq: Every day | ORAL | 6 refills | Status: AC

## 2023-06-11 NOTE — Progress Notes (Signed)
   06/11/2023 8:43 AM   Guy Pearson 1991-02-13 [id number]  Reason for visit: Follow up ED, low testosterone  HPI: 33 year old male with morbid obesity(BMI 41), hypertension on metoprolol, anxiety/depression on SSRIs, who we have followed for the above issues.  He is still interested in having additional children.  He has a history of a low testosterone at 180, and opted for a trial of Clomid 25 mg daily and 5 mg Cialis for his ED.  He had significant improvement in the testosterone to ~750 on Clomid.    He finalized a divorce recently, has also had some changes in his anxiety and depression.  We discussed mood and energy multifactorial, but that testosterone levels have been excellent on the Clomid.  Not currently dating anyone so not using the Cialis regularly.  Testosterone today is pending, will contact with those results.  Cialis and Clomid refilled RTC 1 year testosterone, LH, H/H prior   Guy Pressman, MD  [facility] [address] [place], [place] [address] [phone]

## 2023-06-12 LAB — TESTOSTERONE: Testosterone: 831 ng/dL (ref 264–916)

## 2023-09-25 ENCOUNTER — Ambulatory Visit
Admission: EM | Admit: 2023-09-25 | Discharge: 2023-09-25 | Disposition: A | Discharge status: Home / Self Care | Attending: Family Medicine | Admitting: Family Medicine

## 2023-09-25 ENCOUNTER — Encounter: Payer: Self-pay | Admitting: Emergency Medicine

## 2023-09-25 DIAGNOSIS — L304 Erythema intertrigo: Secondary | ICD-10-CM

## 2023-09-25 DIAGNOSIS — L918 Other hypertrophic disorders of the skin: Secondary | ICD-10-CM

## 2023-09-25 MED ORDER — NYSTATIN-TRIAMCINOLONE 100000-0.1 UNIT/GM-% EX OINT: 1.0000 | TOPICAL_OINTMENT | Freq: Two times a day (BID) | CUTANEOUS | 0 refills | Status: AC

## 2023-09-25 MED ORDER — FLUCONAZOLE 150 MG PO TABS: 150.0000 mg | ORAL_TABLET | ORAL | 0 refills | Status: AC

## 2023-09-25 NOTE — ED Provider Notes (Signed)
 [facility]    CSN: [id number] Arrival date & time: 09/25/23  0909      History   Chief Complaint Chief Complaint  Patient presents with   Rash    HPI Guy Pearson is a 33 y.o. male.   HPI  Guy Pearson presents for rash under his armpits.  He recently switched deodorants.  He tried antibacterial and antifungal soap which didn't help. The soap burned.  Tried spraying [employer] anti-itch spray.  He shaved the hair in his pits after the rash appeared.  States he was preparing his body for the freezing apparatus to remove his armpit skin tags.   He switched from Degree to Native deodorant.  A few days later he started breaking out.  He switched to [employer]ove aluminium free. He is allergic to menthol but the deodorants    There is been no new products including soaps and detergents.  No eye irritation, sore throat, difficulty breathing, nausea, vomiting or diarrhea.  Denies belly pain, joint pain and fever.  There has been no medication changes or new supplements.  Denies any new foods or drinks.   Guy Pearson has otherwise been well and has no other concerns.    Past Medical History:  Diagnosis Date   Anxiety state, unspecified    Hypertension    Other injury of other sites of trunk    Pain in limb    Tachycardia, unspecified     Patient Active Problem List   Diagnosis Date Noted   Chest tightness 09/21/2013   Shortness of breath 09/21/2013   Obesity 04/24/2012   Anxiety 03/15/2011   Cervicalgia 03/15/2011   Hypertension 11/24/2010   Hyperlipidemia 11/24/2010    History reviewed. No pertinent surgical history.     Home Medications    Prior to Admission medications   Medication Sig Start Date End Date Taking? Authorizing Provider  fluconazole  (DIFLUCAN ) 150 MG tablet Take 1 tablet (150 mg total) by mouth every 3 (three) days. 09/25/23  Yes Saraih Lorton, DO  nystatin -triamcinolone  ointment (MYCOLOG) Apply 1 Application topically 2 (two) times daily. 09/25/23  Yes  Micajah Dennin, DO  valsartan (DIOVAN) 80 MG tablet Take 80 mg by mouth. 09/09/23 09/08/24 Yes [provider]  acetaminophen (TYLENOL) 500 MG tablet Take 500-1,000 mg by mouth 3 (three) times daily as needed.    [provider]  amphetamine-dextroamphetamine (ADDERALL XR) 15 MG 24 hr capsule Take 15 mg by mouth every morning. 08/03/22   [provider]  amphetamine-dextroamphetamine (ADDERALL) 5 MG tablet Take 1 tablet by mouth daily. 08/03/22   [provider]  clomiPHENE  (CLOMID ) 50 MG tablet Take 0.5 tablets (25 mg total) by mouth daily. 06/11/23   Francisca Redell BROCKS, MD  FLUoxetine (PROZAC) 40 MG capsule Take 40 mg by mouth daily. 05/17/20   [provider]  fluticasone (FLONASE) 50 MCG/ACT nasal spray Place into both nostrils. 05/17/20   [provider]  metoprolol  tartrate (LOPRESSOR ) 25 MG tablet Take 25 mg by mouth 2 (two) times daily.    [provider]  pantoprazole (PROTONIX) 40 MG tablet Take 1 tablet by mouth daily. 03/29/20   [provider]  tadalafil  (CIALIS ) 5 MG tablet Take 1-2 tablets (5-10 mg total) by mouth daily as needed for erectile dysfunction. 06/11/23   Francisca Redell BROCKS, MD  valACYclovir (VALTREX) 1000 MG tablet TAKE 2 TABLETS BY MOUTH 2 TIMES DAILY FOR 1 DAY AS NEEDED FOR FLARES 12/20/20   [provider]    Family  History Family History  Problem Relation Age of Onset   Hypertension Mother    Hypertension Father    Arrhythmia Father        A-Fib   Hyperlipidemia Paternal Grandfather    Hypertension Paternal Grandfather    Stroke Paternal Grandfather     Social History Social History   Tobacco Use   Smoking status: Never    Passive exposure: Never   Smokeless tobacco: Never  Vaping Use   Vaping status: Never Used  Substance Use Topics   Alcohol use: No   Drug use: No     Allergies   Menthol, Duloxetine, and Venlafaxine   Review of Systems Review of Systems :negative unless  otherwise stated in HPI.      Physical Exam Triage Vital Signs ED Triage Vitals  Encounter Vitals Group     BP 09/25/23 0946 127/82     Girls Systolic BP Percentile --      Girls Diastolic BP Percentile --      Boys Systolic BP Percentile --      Boys Diastolic BP Percentile --      Pulse Rate 09/25/23 0946 73     Resp 09/25/23 0946 16     Temp 09/25/23 0946 99.2 F (37.3 C)     Temp Source 09/25/23 0946 Oral     SpO2 09/25/23 0946 100 %     Weight --      Height --      Head Circumference --      Peak Flow --      Pain Score 09/25/23 0944 3     Pain Loc --      Pain Education --      Exclude from Growth Chart --    No data found.  Updated Vital Signs BP 127/82   Pulse 73   Temp 99.2 F (37.3 C) (Oral)   Resp 16   SpO2 100%   Visual Acuity Right Eye Distance:   Left Eye Distance:   Bilateral Distance:    Right Eye Near:   Left Eye Near:    Bilateral Near:     Physical Exam  GEN: alert, well appearing male, in no acute distress  EYES: no scleral injection or discharge CV: regular rate  RESP: no increased work of breathing MSK: good ROM of bilateral shoulders  NEURO: alert, moves all extremities appropriately SKIN: warm and dry; erythematous patches with discharge, blistering or fluctuance in the bilateral axilla     UC Treatments / Results  Labs (all labs ordered are listed, but only abnormal results are displayed) Labs Reviewed - No data to display  EKG   Radiology No results found.  Procedures Procedures (including critical care time)  Medications Ordered in UC Medications - No data to display  Initial Impression / Assessment and Plan / UC Course  I have reviewed the triage vital signs and the nursing notes.  Pertinent labs & imaging results that were available during my care of the patient were reviewed by me and considered in my medical decision making (see chart for details).     Patient is a 33 y.o. malewho presents for bilateral  armpit rash.  Overall, patient is well-appearing and well-hydrated.  Vital signs stable.  [name] is afebrile.  Exam concerning for fungal intertrigo.  Treat with antifungal-steroid ointment and Diflucan . No sign of infection to suggest antibiotics at this time.  Not likely viral exanthem.   Reviewed expectations regarding course of current medical issues.  All questions asked were answered.  Outlined signs and symptoms indicating need for more acute intervention. Patient verbalized understanding. After Visit Summary given.   Final Clinical Impressions(s) / UC Diagnoses   Final diagnoses:  Intertrigo  Skin tags, multiple acquired     Discharge Instructions      Stop by the pharmacy to pick up your prescriptions.  Follow up with your primary care provider or return to the urgent care, if not improving.   Follow up with a dermatologist for your skin tags.       ED Prescriptions     Medication Sig Dispense Auth. Provider   nystatin -triamcinolone  ointment (MYCOLOG) Apply 1 Application topically 2 (two) times daily. 30 g Oluw[place]iwa Kinyon, DO   fluconazole  (DIFLUCAN ) 150 MG tablet Take 1 tablet (150 mg total) by mouth every 3 (three) days. 1 tablet Kriste Berth, DO      PDMP not reviewed this encounter.              Kriste Berth, DO 09/25/23 1052

## 2023-09-25 NOTE — ED Triage Notes (Signed)
 Pt presents with a rash underneath bilateral arms x 1 week. Pt states he recently switched deodorant and noticed the irritation after. He has tried antibacterial creams and spray, which made his symptoms worse.

## 2023-09-25 NOTE — Discharge Instructions (Addendum)
 Stop by the pharmacy to pick up your prescriptions.  Follow up with your primary care provider or return to the urgent care, if not improving.   Follow up with a dermatologist for your skin tags.

## 2023-10-24 ENCOUNTER — Encounter: Payer: Self-pay | Admitting: Urology

## 2024-06-09 ENCOUNTER — Ambulatory Visit: Admitting: Urology

## 2024-06-10 ENCOUNTER — Ambulatory Visit: Admitting: Urology
